# Patient Record
Sex: Female | Born: 1990 | Race: Black or African American | Hispanic: No | Marital: Single | Smoking: Current every day smoker
Health system: Southern US, Community
[De-identification: ages and names within clinical notes are randomized; demographics above are authoritative.]

## PROBLEM LIST (undated history)

## (undated) DIAGNOSIS — F32A Depression, unspecified: Secondary | ICD-10-CM

## (undated) DIAGNOSIS — F329 Major depressive disorder, single episode, unspecified: Secondary | ICD-10-CM

## (undated) HISTORY — PX: NO PAST SURGERIES: SHX2092

## (undated) HISTORY — DX: Depression, unspecified: F32.A

## (undated) HISTORY — DX: Major depressive disorder, single episode, unspecified: F32.9

---
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
## 2010-01-10 ENCOUNTER — Emergency Department (HOSPITAL_COMMUNITY): Admission: EM | Admit: 2010-01-10 | Discharge: 2010-01-10 | Payer: Self-pay | Admitting: Emergency Medicine

## 2010-07-10 ENCOUNTER — Emergency Department: Payer: Self-pay | Admitting: Emergency Medicine

## 2010-08-28 ENCOUNTER — Emergency Department (HOSPITAL_COMMUNITY): Admission: EM | Admit: 2010-08-28 | Discharge: 2010-08-28 | Payer: Self-pay | Admitting: Emergency Medicine

## 2010-09-26 ENCOUNTER — Encounter: Payer: Self-pay | Admitting: Emergency Medicine

## 2010-09-26 ENCOUNTER — Inpatient Hospital Stay (HOSPITAL_COMMUNITY): Admission: AD | Admit: 2010-09-26 | Discharge: 2010-09-26 | Payer: Self-pay | Admitting: Obstetrics and Gynecology

## 2010-11-17 NOTE — L&D Delivery Note (Signed)
Delivery Note At 5:43 PM a viable female was delivered via Vaginal, Spontaneous Delivery (Presentation: ; Occiput Anterior).  APGAR: 9, 9; weight 5 lb 9.6 oz (2540 g).   Placenta status: Intact, Spontaneous.  Cord: 3 vessels with the following complications: None.  Cord p*not done  Anesthesia: Epidural  Episiotomy: None Lacerations:  Suture Repair: 2.0 Est. Blood Loss (mL):   Mom to postpartum.  Baby to nursery-stable.  MARSHALL,BERNARD A 09/01/2011, 6:00 PM

## 2010-12-21 ENCOUNTER — Emergency Department (HOSPITAL_COMMUNITY)
Admission: EM | Admit: 2010-12-21 | Discharge: 2010-12-21 | Disposition: A | Discharge status: Home / Self Care | Payer: Medicaid Other | Attending: Emergency Medicine | Admitting: Emergency Medicine

## 2010-12-21 DIAGNOSIS — R110 Nausea: Secondary | ICD-10-CM | POA: Insufficient documentation

## 2010-12-21 DIAGNOSIS — R220 Localized swelling, mass and lump, head: Secondary | ICD-10-CM | POA: Insufficient documentation

## 2010-12-21 DIAGNOSIS — K089 Disorder of teeth and supporting structures, unspecified: Secondary | ICD-10-CM | POA: Insufficient documentation

## 2010-12-21 DIAGNOSIS — R6883 Chills (without fever): Secondary | ICD-10-CM | POA: Insufficient documentation

## 2011-01-04 ENCOUNTER — Inpatient Hospital Stay (HOSPITAL_COMMUNITY)
Admission: AD | Admit: 2011-01-04 | Discharge: 2011-01-04 | Disposition: A | Discharge status: Home / Self Care | Payer: Medicaid Other | Source: Ambulatory Visit | Attending: Obstetrics & Gynecology | Admitting: Obstetrics & Gynecology

## 2011-01-04 ENCOUNTER — Inpatient Hospital Stay (HOSPITAL_COMMUNITY): Payer: Medicaid Other

## 2011-01-04 DIAGNOSIS — N760 Acute vaginitis: Secondary | ICD-10-CM

## 2011-01-04 DIAGNOSIS — B9689 Other specified bacterial agents as the cause of diseases classified elsewhere: Secondary | ICD-10-CM | POA: Insufficient documentation

## 2011-01-04 DIAGNOSIS — R109 Unspecified abdominal pain: Secondary | ICD-10-CM

## 2011-01-04 DIAGNOSIS — O2390 Unspecified genitourinary tract infection in pregnancy, unspecified trimester: Secondary | ICD-10-CM | POA: Insufficient documentation

## 2011-01-04 DIAGNOSIS — A499 Bacterial infection, unspecified: Secondary | ICD-10-CM

## 2011-01-04 LAB — URINALYSIS, ROUTINE W REFLEX MICROSCOPIC
Bilirubin Urine: NEGATIVE
Hgb urine dipstick: NEGATIVE
Protein, ur: NEGATIVE mg/dL
Specific Gravity, Urine: 1.025 (ref 1.005–1.030)
Urobilinogen, UA: 1 mg/dL (ref 0.0–1.0)
pH: 7 (ref 5.0–8.0)

## 2011-01-04 LAB — WET PREP, GENITAL
Trich, Wet Prep: NONE SEEN
Yeast Wet Prep HPF POC: NONE SEEN

## 2011-01-04 LAB — ABO/RH: ABO/RH(D): O POS

## 2011-01-04 LAB — HCG, QUANTITATIVE, PREGNANCY: hCG, Beta Chain, Quant, S: 4549 m[IU]/mL — ABNORMAL HIGH (ref <5)

## 2011-01-04 LAB — POCT PREGNANCY, URINE: Preg Test, Ur: POSITIVE

## 2011-01-06 LAB — GC/CHLAMYDIA PROBE AMP, GENITAL: Chlamydia, DNA Probe: NEGATIVE

## 2011-01-23 ENCOUNTER — Inpatient Hospital Stay (HOSPITAL_COMMUNITY)
Admission: AD | Admit: 2011-01-23 | Discharge: 2011-01-23 | Disposition: A | Discharge status: Home / Self Care | Payer: Medicaid Other | Source: Ambulatory Visit | Attending: Obstetrics & Gynecology | Admitting: Obstetrics & Gynecology

## 2011-01-23 ENCOUNTER — Encounter (HOSPITAL_COMMUNITY): Payer: Self-pay | Admitting: Radiology

## 2011-01-23 ENCOUNTER — Inpatient Hospital Stay (HOSPITAL_COMMUNITY): Payer: Medicaid Other

## 2011-01-23 DIAGNOSIS — R52 Pain, unspecified: Secondary | ICD-10-CM

## 2011-01-23 DIAGNOSIS — O9989 Other specified diseases and conditions complicating pregnancy, childbirth and the puerperium: Secondary | ICD-10-CM

## 2011-01-23 DIAGNOSIS — O99891 Other specified diseases and conditions complicating pregnancy: Secondary | ICD-10-CM | POA: Insufficient documentation

## 2011-01-28 LAB — CBC
HCT: 30.6 % — ABNORMAL LOW (ref 36.0–46.0)
Hemoglobin: 10.6 g/dL — ABNORMAL LOW (ref 12.0–15.0)
MCH: 30.6 pg (ref 26.0–34.0)
MCHC: 34.6 g/dL (ref 30.0–36.0)
MCV: 88.4 fL (ref 78.0–100.0)
Platelets: 239 10*3/uL (ref 150–400)
RBC: 3.46 MIL/uL — ABNORMAL LOW (ref 3.87–5.11)
RDW: 12.6 % (ref 11.5–15.5)
WBC: 10.6 10*3/uL — ABNORMAL HIGH (ref 4.0–10.5)

## 2011-01-28 LAB — DIFFERENTIAL
Basophils Absolute: 0 10*3/uL (ref 0.0–0.1)
Basophils Relative: 0 % (ref 0–1)
Eosinophils Absolute: 0.1 10*3/uL (ref 0.0–0.7)
Eosinophils Relative: 1 % (ref 0–5)
Lymphocytes Relative: 16 % (ref 12–46)
Lymphs Abs: 1.7 10*3/uL (ref 0.7–4.0)
Monocytes Absolute: 0.8 10*3/uL (ref 0.1–1.0)
Monocytes Relative: 7 % (ref 3–12)
Neutro Abs: 8.1 10*3/uL — ABNORMAL HIGH (ref 1.7–7.7)
Neutrophils Relative %: 76 % (ref 43–77)

## 2011-01-28 LAB — ABO/RH: ABO/RH(D): O POS

## 2011-01-28 LAB — HCG, QUANTITATIVE, PREGNANCY: hCG, Beta Chain, Quant, S: 857 m[IU]/mL — ABNORMAL HIGH (ref <5)

## 2011-02-05 LAB — URINALYSIS, ROUTINE W REFLEX MICROSCOPIC
Bilirubin Urine: NEGATIVE
Glucose, UA: NEGATIVE mg/dL
Ketones, ur: NEGATIVE mg/dL
Nitrite: NEGATIVE
Protein, ur: NEGATIVE mg/dL
Specific Gravity, Urine: 1.026 (ref 1.005–1.030)
Urobilinogen, UA: 0.2 mg/dL (ref 0.0–1.0)
pH: 6 (ref 5.0–8.0)

## 2011-02-05 LAB — URINE MICROSCOPIC-ADD ON

## 2011-02-05 LAB — URINE CULTURE
Colony Count: NO GROWTH
Culture: NO GROWTH

## 2011-02-05 LAB — POCT PREGNANCY, URINE: Preg Test, Ur: NEGATIVE

## 2011-03-24 LAB — ANTIBODY SCREEN: Antibody Screen: NEGATIVE

## 2011-03-24 LAB — HIV ANTIBODY (ROUTINE TESTING W REFLEX): HIV: NONREACTIVE

## 2011-03-24 LAB — ABO/RH: RH Type: POSITIVE

## 2011-03-24 LAB — RPR: RPR: NONREACTIVE

## 2011-03-24 LAB — HEPATITIS B SURFACE ANTIGEN: Hepatitis B Surface Ag: NEGATIVE

## 2011-03-25 LAB — RUBELLA ANTIBODY, IGM: Rubella: UNDETERMINED
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
## 2011-04-24 ENCOUNTER — Emergency Department (HOSPITAL_COMMUNITY)
Admission: EM | Admit: 2011-04-24 | Discharge: 2011-04-24 | Disposition: A | Discharge status: Home / Self Care | Payer: Medicaid Other | Attending: Emergency Medicine | Admitting: Emergency Medicine

## 2011-04-24 DIAGNOSIS — O9989 Other specified diseases and conditions complicating pregnancy, childbirth and the puerperium: Secondary | ICD-10-CM | POA: Insufficient documentation

## 2011-04-24 DIAGNOSIS — R109 Unspecified abdominal pain: Secondary | ICD-10-CM | POA: Insufficient documentation

## 2011-04-24 DIAGNOSIS — R110 Nausea: Secondary | ICD-10-CM | POA: Insufficient documentation

## 2011-04-24 LAB — URINALYSIS, ROUTINE W REFLEX MICROSCOPIC
Bilirubin Urine: NEGATIVE
Glucose, UA: NEGATIVE mg/dL
Hgb urine dipstick: NEGATIVE
Ketones, ur: 15 mg/dL — AB
Nitrite: NEGATIVE
Protein, ur: NEGATIVE mg/dL
Specific Gravity, Urine: 1.023 (ref 1.005–1.030)
Urobilinogen, UA: 1 mg/dL (ref 0.0–1.0)
pH: 6 (ref 5.0–8.0)

## 2011-05-04 ENCOUNTER — Emergency Department (HOSPITAL_COMMUNITY)
Admission: EM | Admit: 2011-05-04 | Discharge: 2011-05-04 | Disposition: A | Discharge status: Home / Self Care | Payer: Medicaid Other | Attending: Emergency Medicine | Admitting: Emergency Medicine

## 2011-05-04 DIAGNOSIS — N94819 Vulvodynia, unspecified: Secondary | ICD-10-CM | POA: Insufficient documentation

## 2011-05-04 DIAGNOSIS — N751 Abscess of Bartholin's gland: Secondary | ICD-10-CM | POA: Insufficient documentation

## 2011-05-07 ENCOUNTER — Inpatient Hospital Stay (HOSPITAL_COMMUNITY)
Admission: AD | Admit: 2011-05-07 | Discharge: 2011-05-07 | Disposition: A | Discharge status: Home / Self Care | Payer: Medicaid Other | Source: Ambulatory Visit | Attending: Obstetrics | Admitting: Obstetrics

## 2011-05-07 DIAGNOSIS — O2390 Unspecified genitourinary tract infection in pregnancy, unspecified trimester: Secondary | ICD-10-CM

## 2011-05-07 DIAGNOSIS — N751 Abscess of Bartholin's gland: Secondary | ICD-10-CM | POA: Insufficient documentation

## 2011-05-07 DIAGNOSIS — Z79899 Other long term (current) drug therapy: Secondary | ICD-10-CM | POA: Insufficient documentation

## 2011-06-16 ENCOUNTER — Inpatient Hospital Stay (HOSPITAL_COMMUNITY): Admission: AD | Admit: 2011-06-16 | Payer: Self-pay | Source: Ambulatory Visit | Admitting: Obstetrics

## 2011-07-29 LAB — GC/CHLAMYDIA PROBE AMP, GENITAL: Chlamydia: POSITIVE

## 2011-07-31 ENCOUNTER — Other Ambulatory Visit (HOSPITAL_COMMUNITY): Payer: Self-pay | Admitting: Obstetrics

## 2011-07-31 DIAGNOSIS — Z3689 Encounter for other specified antenatal screening: Secondary | ICD-10-CM

## 2011-07-31 DIAGNOSIS — IMO0002 Reserved for concepts with insufficient information to code with codable children: Secondary | ICD-10-CM

## 2011-08-05 ENCOUNTER — Ambulatory Visit (HOSPITAL_COMMUNITY)
Admission: RE | Admit: 2011-08-05 | Discharge: 2011-08-05 | Disposition: A | Discharge status: Home / Self Care | Payer: Medicaid Other | Source: Ambulatory Visit | Attending: Obstetrics | Admitting: Obstetrics

## 2011-08-05 DIAGNOSIS — Z3689 Encounter for other specified antenatal screening: Secondary | ICD-10-CM

## 2011-08-05 DIAGNOSIS — IMO0002 Reserved for concepts with insufficient information to code with codable children: Secondary | ICD-10-CM

## 2011-08-05 DIAGNOSIS — O365990 Maternal care for other known or suspected poor fetal growth, unspecified trimester, not applicable or unspecified: Secondary | ICD-10-CM | POA: Insufficient documentation

## 2011-08-22 ENCOUNTER — Encounter (HOSPITAL_COMMUNITY): Payer: Self-pay | Admitting: *Deleted

## 2011-08-28 ENCOUNTER — Telehealth (HOSPITAL_COMMUNITY): Payer: Self-pay | Admitting: *Deleted

## 2011-08-28 ENCOUNTER — Encounter (HOSPITAL_COMMUNITY): Payer: Self-pay | Admitting: *Deleted

## 2011-08-28 NOTE — Telephone Encounter (Signed)
Preadmission screen  

## 2011-09-01 ENCOUNTER — Inpatient Hospital Stay (HOSPITAL_COMMUNITY): Payer: Medicaid Other | Admitting: Anesthesiology

## 2011-09-01 ENCOUNTER — Encounter (HOSPITAL_COMMUNITY): Payer: Self-pay | Admitting: Anesthesiology

## 2011-09-01 ENCOUNTER — Encounter (HOSPITAL_COMMUNITY): Payer: Self-pay

## 2011-09-01 ENCOUNTER — Inpatient Hospital Stay (HOSPITAL_COMMUNITY)
Admission: RE | Admit: 2011-09-01 | Discharge: 2011-09-04 | DRG: 775 | Disposition: A | Discharge status: Home / Self Care | Payer: Medicaid Other | Source: Ambulatory Visit | Attending: Obstetrics | Admitting: Obstetrics

## 2011-09-01 DIAGNOSIS — O99893 Other specified diseases and conditions complicating puerperium: Principal | ICD-10-CM | POA: Diagnosis not present

## 2011-09-01 DIAGNOSIS — R030 Elevated blood-pressure reading, without diagnosis of hypertension: Secondary | ICD-10-CM | POA: Diagnosis not present

## 2011-09-01 LAB — CBC
HCT: 35.2 % — ABNORMAL LOW (ref 36.0–46.0)
HCT: 37.3 % (ref 36.0–46.0)
Hemoglobin: 12.8 g/dL (ref 12.0–15.0)
Hemoglobin: 13.2 g/dL (ref 12.0–15.0)
MCH: 32.5 pg (ref 26.0–34.0)
MCH: 32.3 pg (ref 26.0–34.0)
MCHC: 34.7 g/dL (ref 30.0–36.0)
MCHC: 35.6 g/dL (ref 30.0–36.0)
MCHC: 35.4 g/dL (ref 30.0–36.0)
MCV: 91.2 fL (ref 78.0–100.0)
Platelets: 189 10*3/uL (ref 150–400)
Platelets: 188 10*3/uL (ref 150–400)
RBC: 3.94 MIL/uL (ref 3.87–5.11)
RDW: 12.9 % (ref 11.5–15.5)
WBC: 8.4 10*3/uL (ref 4.0–10.5)

## 2011-09-01 LAB — COMPREHENSIVE METABOLIC PANEL
ALT: 12 U/L (ref 0–35)
AST: 24 U/L (ref 0–37)
Alkaline Phosphatase: 164 U/L — ABNORMAL HIGH (ref 39–117)
CO2: 26 mEq/L (ref 19–32)
Calcium: 9.8 mg/dL (ref 8.4–10.5)
Glucose, Bld: 93 mg/dL (ref 70–99)
Potassium: 3.9 mEq/L (ref 3.5–5.1)
Sodium: 133 mEq/L — ABNORMAL LOW (ref 135–145)
Total Protein: 7.3 g/dL (ref 6.0–8.3)

## 2011-09-01 LAB — RPR: RPR Ser Ql: NONREACTIVE

## 2011-09-01 MED ORDER — OXYCODONE-ACETAMINOPHEN 5-325 MG PO TABS: 2.0000 | ORAL_TABLET | ORAL | Status: DC | PRN

## 2011-09-01 MED ORDER — LACTATED RINGERS IV SOLN: 500.0000 mL | INTRAVENOUS | Status: DC | PRN

## 2011-09-01 MED ORDER — OXYTOCIN 20 UNITS IN LACTATED RINGERS INFUSION - SIMPLE
125.0000 mL/h | Freq: Once | INTRAVENOUS | Status: AC
  Administered 2011-09-01: 999 mL/h via INTRAVENOUS

## 2011-09-01 MED ORDER — PHENYLEPHRINE 40 MCG/ML (10ML) SYRINGE FOR IV PUSH (FOR BLOOD PRESSURE SUPPORT)
80.0000 ug | PREFILLED_SYRINGE | INTRAVENOUS | Status: DC | PRN
  Filled 2011-09-01 (×2): qty 5

## 2011-09-01 MED ORDER — FLEET ENEMA 7-19 GM/118ML RE ENEM: 1.0000 | ENEMA | RECTAL | Status: DC | PRN

## 2011-09-01 MED ORDER — MAGNESIUM SULFATE 40 G IN LACTATED RINGERS - SIMPLE
2.0000 g/h | INTRAVENOUS | Status: DC
  Administered 2011-09-01: 8 g/h via INTRAVENOUS
  Filled 2011-09-01: qty 500

## 2011-09-01 MED ORDER — CITRIC ACID-SODIUM CITRATE 334-500 MG/5ML PO SOLN: 30.0000 mL | ORAL | Status: DC | PRN

## 2011-09-01 MED ORDER — SODIUM CHLORIDE 0.9 % IJ SOLN: 3.0000 mL | Freq: Two times a day (BID) | INTRAMUSCULAR | Status: DC

## 2011-09-01 MED ORDER — MISOPROSTOL 25 MCG QUARTER TABLET
25.0000 ug | ORAL_TABLET | ORAL | Status: DC
  Administered 2011-09-01: 25 ug via VAGINAL
  Filled 2011-09-01 (×3): qty 1
  Filled 2011-09-01: qty 0.25
  Filled 2011-09-01: qty 1

## 2011-09-01 MED ORDER — LABETALOL HCL 5 MG/ML IV SOLN
INTRAVENOUS | Status: AC
  Filled 2011-09-01: qty 4

## 2011-09-01 MED ORDER — ACETAMINOPHEN 325 MG PO TABS: 650.0000 mg | ORAL_TABLET | ORAL | Status: DC | PRN

## 2011-09-01 MED ORDER — PROMETHAZINE HCL 25 MG/ML IJ SOLN: 12.5000 mg | INTRAMUSCULAR | Status: DC | PRN

## 2011-09-01 MED ORDER — FERROUS SULFATE 325 (65 FE) MG PO TABS
325.0000 mg | ORAL_TABLET | Freq: Two times a day (BID) | ORAL | Status: DC
  Administered 2011-09-02 – 2011-09-04 (×4): 325 mg via ORAL
  Filled 2011-09-01 (×4): qty 1

## 2011-09-01 MED ORDER — BUTORPHANOL TARTRATE 2 MG/ML IJ SOLN
1.0000 mg | INTRAMUSCULAR | Status: DC | PRN
  Administered 2011-09-01 (×2): 1 mg via INTRAVENOUS
  Filled 2011-09-01: qty 1

## 2011-09-01 MED ORDER — SODIUM CHLORIDE 0.9 % IJ SOLN: 3.0000 mL | INTRAMUSCULAR | Status: DC | PRN

## 2011-09-01 MED ORDER — LIDOCAINE HCL 1.5 % IJ SOLN
INTRAMUSCULAR | Status: DC | PRN
  Administered 2011-09-01 (×2): 5 mL via INTRADERMAL
  Administered 2011-09-01: 2 mL via INTRADERMAL

## 2011-09-01 MED ORDER — ZOLPIDEM TARTRATE 5 MG PO TABS: 5.0000 mg | ORAL_TABLET | Freq: Every evening | ORAL | Status: DC | PRN

## 2011-09-01 MED ORDER — TETANUS-DIPHTH-ACELL PERTUSSIS 5-2.5-18.5 LF-MCG/0.5 IM SUSP
0.5000 mL | Freq: Once | INTRAMUSCULAR | Status: DC
  Filled 2011-09-01: qty 0.5

## 2011-09-01 MED ORDER — SIMETHICONE 80 MG PO CHEW: 80.0000 mg | CHEWABLE_TABLET | ORAL | Status: DC | PRN

## 2011-09-01 MED ORDER — ONDANSETRON HCL 4 MG/2ML IJ SOLN: 4.0000 mg | Freq: Four times a day (QID) | INTRAMUSCULAR | Status: DC | PRN

## 2011-09-01 MED ORDER — BUTORPHANOL TARTRATE 2 MG/ML IJ SOLN
INTRAMUSCULAR | Status: AC
  Administered 2011-09-01: 1 mg via INTRAVENOUS
  Filled 2011-09-01: qty 1

## 2011-09-01 MED ORDER — IBUPROFEN 600 MG PO TABS: 600.0000 mg | ORAL_TABLET | Freq: Four times a day (QID) | ORAL | Status: DC | PRN

## 2011-09-01 MED ORDER — INFLUENZA VIRUS VACC SPLIT PF IM SUSP
0.5000 mL | Freq: Once | INTRAMUSCULAR | Status: AC
  Administered 2011-09-02: 0.5 mL via INTRAMUSCULAR
  Filled 2011-09-01: qty 0.5

## 2011-09-01 MED ORDER — SODIUM CHLORIDE 0.9 % IV SOLN: 250.0000 mL | INTRAVENOUS | Status: DC

## 2011-09-01 MED ORDER — LIDOCAINE HCL (PF) 1 % IJ SOLN
30.0000 mL | INTRAMUSCULAR | Status: DC | PRN
  Filled 2011-09-01 (×2): qty 30

## 2011-09-01 MED ORDER — FENTANYL 2.5 MCG/ML BUPIVACAINE 1/10 % EPIDURAL INFUSION (WH - ANES)
14.0000 mL/h | INTRAMUSCULAR | Status: DC
  Administered 2011-09-01: 14 mL/h via EPIDURAL
  Filled 2011-09-01 (×2): qty 60

## 2011-09-01 MED ORDER — SENNOSIDES-DOCUSATE SODIUM 8.6-50 MG PO TABS
2.0000 | ORAL_TABLET | Freq: Every day | ORAL | Status: DC
  Administered 2011-09-01 – 2011-09-02 (×2): 2 via ORAL

## 2011-09-01 MED ORDER — OXYCODONE-ACETAMINOPHEN 5-325 MG PO TABS: 1.0000 | ORAL_TABLET | ORAL | Status: DC | PRN

## 2011-09-01 MED ORDER — ONDANSETRON HCL 4 MG PO TABS: 4.0000 mg | ORAL_TABLET | ORAL | Status: DC | PRN

## 2011-09-01 MED ORDER — BENZOCAINE-MENTHOL 20-0.5 % EX AERO: 1.0000 "application " | INHALATION_SPRAY | CUTANEOUS | Status: DC | PRN

## 2011-09-01 MED ORDER — DIPHENHYDRAMINE HCL 50 MG/ML IJ SOLN: 12.5000 mg | INTRAMUSCULAR | Status: DC | PRN

## 2011-09-01 MED ORDER — MAGNESIUM SULFATE BOLUS VIA INFUSION
4.0000 g | Freq: Once | INTRAVENOUS | Status: DC
  Filled 2011-09-01: qty 500

## 2011-09-01 MED ORDER — LANOLIN HYDROUS EX OINT: TOPICAL_OINTMENT | CUTANEOUS | Status: DC | PRN

## 2011-09-01 MED ORDER — ONDANSETRON HCL 4 MG/2ML IJ SOLN: 4.0000 mg | INTRAMUSCULAR | Status: DC | PRN

## 2011-09-01 MED ORDER — WITCH HAZEL-GLYCERIN EX PADS: 1.0000 "application " | MEDICATED_PAD | CUTANEOUS | Status: DC | PRN

## 2011-09-01 MED ORDER — PHENYLEPHRINE 40 MCG/ML (10ML) SYRINGE FOR IV PUSH (FOR BLOOD PRESSURE SUPPORT)
80.0000 ug | PREFILLED_SYRINGE | INTRAVENOUS | Status: DC | PRN
  Filled 2011-09-01: qty 5

## 2011-09-01 MED ORDER — OXYTOCIN BOLUS FROM INFUSION
500.0000 mL | Freq: Once | INTRAVENOUS | Status: DC
Start: 2011-09-01 — End: 2011-09-01
  Filled 2011-09-01: qty 1000
  Filled 2011-09-01: qty 500

## 2011-09-01 MED ORDER — LABETALOL HCL 5 MG/ML IV SOLN
40.0000 mg | Freq: Once | INTRAVENOUS | Status: AC
  Administered 2011-09-01: 40 mg via INTRAVENOUS

## 2011-09-01 MED ORDER — EPHEDRINE 5 MG/ML INJ
10.0000 mg | INTRAVENOUS | Status: DC | PRN
  Filled 2011-09-01 (×2): qty 4

## 2011-09-01 MED ORDER — DIBUCAINE 1 % RE OINT: 1.0000 "application " | TOPICAL_OINTMENT | RECTAL | Status: DC | PRN

## 2011-09-01 MED ORDER — PRENATAL PLUS 27-1 MG PO TABS
1.0000 | ORAL_TABLET | Freq: Every day | ORAL | Status: DC
  Administered 2011-09-02 – 2011-09-04 (×3): 1 via ORAL
  Filled 2011-09-01 (×3): qty 1

## 2011-09-01 MED ORDER — EPHEDRINE 5 MG/ML INJ
10.0000 mg | INTRAVENOUS | Status: DC | PRN
  Filled 2011-09-01: qty 4

## 2011-09-01 MED ORDER — LABETALOL HCL 5 MG/ML IV SOLN
INTRAVENOUS | Status: AC
  Administered 2011-09-01: 40 mg via INTRAVENOUS
  Filled 2011-09-01: qty 4

## 2011-09-01 MED ORDER — DIPHENHYDRAMINE HCL 25 MG PO CAPS: 25.0000 mg | ORAL_CAPSULE | Freq: Four times a day (QID) | ORAL | Status: DC | PRN

## 2011-09-01 MED ORDER — ZOLPIDEM TARTRATE 10 MG PO TABS: 10.0000 mg | ORAL_TABLET | Freq: Every evening | ORAL | Status: DC | PRN

## 2011-09-01 MED ORDER — IBUPROFEN 600 MG PO TABS
600.0000 mg | ORAL_TABLET | Freq: Four times a day (QID) | ORAL | Status: DC
  Administered 2011-09-02 – 2011-09-04 (×9): 600 mg via ORAL
  Filled 2011-09-01 (×9): qty 1

## 2011-09-01 MED ORDER — LACTATED RINGERS IV SOLN
500.0000 mL | Freq: Once | INTRAVENOUS | Status: AC
  Administered 2011-09-01: 1000 mL via INTRAVENOUS

## 2011-09-01 MED ORDER — LACTATED RINGERS IV SOLN
INTRAVENOUS | Status: DC
  Administered 2011-09-01: 22:00:00 via INTRAVENOUS

## 2011-09-01 MED ORDER — LACTATED RINGERS IV SOLN
INTRAVENOUS | Status: DC
  Administered 2011-09-01 (×4): via INTRAVENOUS

## 2011-09-01 MED ORDER — TERBUTALINE SULFATE 1 MG/ML IJ SOLN: 0.2500 mg | Freq: Once | INTRAMUSCULAR | Status: DC | PRN

## 2011-09-01 NOTE — Anesthesia Preprocedure Evaluation (Signed)
Anesthesia Evaluation  Name, MR# and DOB Patient awake  General Assessment Comment  Reviewed: Allergy & Precautions, H&P , NPO status , Patient's Chart, lab work & pertinent test results, reviewed documented beta blocker date and time   History of Anesthesia Complications Negative for: history of anesthetic complications  Airway Mallampati: II TM Distance: >3 FB Neck ROM: full    Dental  (+) Teeth Intact   Pulmonary asthma (self-diagnosed, has used an inhaler once) Current Smoker  clear to auscultation        Cardiovascular regular Normal    Neuro/Psych PSYCHIATRIC DISORDERS (depression) Negative Neurological ROS     GI/Hepatic negative GI ROS Neg liver ROS    Endo/Other  Negative Endocrine ROS  Renal/GU negative Renal ROS  Genitourinary negative   Musculoskeletal   Abdominal   Peds  Hematology negative hematology ROS (+)   Anesthesia Other Findings   Reproductive/Obstetrics (+) Pregnancy                           Anesthesia Physical Anesthesia Plan  ASA: II  Anesthesia Plan: Epidural   Post-op Pain Management:    Induction:   Airway Management Planned:   Additional Equipment:   Intra-op Plan:   Post-operative Plan:   Informed Consent: I have reviewed the patients History and Physical, chart, labs and discussed the procedure including the risks, benefits and alternatives for the proposed anesthesia with the patient or authorized representative who has indicated his/her understanding and acceptance.     Plan Discussed with:   Anesthesia Plan Comments:         Anesthesia Quick Evaluation

## 2011-09-01 NOTE — Progress Notes (Signed)
Patient is fully dilated in the post 2 station she is not on Pitocin and underwent and in labor this morning spontaneously after the Cytotec was inserted and and

## 2011-09-01 NOTE — Progress Notes (Signed)
Dr. Malen Gauze notified of CBC results.  Order received to remove epidural catheter.

## 2011-09-01 NOTE — H&P (Signed)
This is Dr. Francoise Ceo dictating the history and physical on Gloria King She's a gravida 3 para 00-0 at 40 weeks and 3 days EDC 08/29/2011 Negative GBS in for induction Cervix 1 cm 80% vertex -3 not contracting and she will be placed on Cytotec 25 Max x2 today And then Pitocin if necessary Past medical history negative Past surgical history negative Family history negative Social history negative Physical exam A well-developed female in no distress HEENT negative Breasts negative Heart regular is in no murmurs no gallops Lungs clear Abdomen term Pelvic as described above Extremities negative

## 2011-09-01 NOTE — Anesthesia Procedure Notes (Signed)
Epidural Patient location during procedure: OB Start time: 09/01/2011 1:24 PM Reason for block: procedure for pain  Staffing Performed by: anesthesiologist   Preanesthetic Checklist Completed: patient identified, site marked, surgical consent, pre-op evaluation, timeout performed, IV checked, risks and benefits discussed and monitors and equipment checked  Epidural Patient position: sitting Prep: site prepped and draped and DuraPrep Patient monitoring: continuous pulse ox and blood pressure Approach: midline Injection technique: LOR air  Needle:  Needle type: Tuohy  Needle gauge: 17 G Needle length: 9 cm Needle insertion depth: 6 cm Catheter type: closed end flexible Catheter size: 19 Gauge Catheter at skin depth: 11 cm Test dose: negative  Assessment Events: blood not aspirated, injection not painful, no injection resistance, negative IV test and no paresthesia  Additional Notes Discussed risk of headache, infection, bleeding, nerve injury and failed or incomplete block.  Patient voices understanding and wishes to proceed.  Patient impatient during informed consent and pre-evaluation.

## 2011-09-01 NOTE — Progress Notes (Signed)
Pt rec'd from [facility] via wheelchair.  Ambulated to BR with minimal assistance, voided qs.  Peri care, ice pack applied to perineum.  Teaching done.  Ambulatory to bed, tolerated well, steady gait and no c/o lighthead or dizziness.   In bed, SR up x 2.  Admitting wt obtained.  Monitor applied, VS obtained.  Initial assessment completed, 1 bt clonus noted bilaterally.  HOB elevated.  Plan of care discusssed.  SO at bs, supportive.  Baby in room.

## 2011-09-02 LAB — COMPREHENSIVE METABOLIC PANEL
ALT: 11 U/L (ref 0–35)
AST: 23 U/L (ref 0–37)
Albumin: 2.4 g/dL — ABNORMAL LOW (ref 3.5–5.2)
Alkaline Phosphatase: 121 U/L — ABNORMAL HIGH (ref 39–117)
BUN: 4 mg/dL — ABNORMAL LOW (ref 6–23)
CO2: 25 mEq/L (ref 19–32)
Calcium: 7.7 mg/dL — ABNORMAL LOW (ref 8.4–10.5)
Chloride: 102 mEq/L (ref 96–112)
Creatinine, Ser: 0.47 mg/dL — ABNORMAL LOW (ref 0.50–1.10)
GFR calc Af Amer: >90 mL/min (ref >90)
GFR calc non Af Amer: >90 mL/min (ref >90)
Glucose, Bld: 89 mg/dL (ref 70–99)
Potassium: 3.6 mEq/L (ref 3.5–5.1)
Sodium: 133 mEq/L — ABNORMAL LOW (ref 135–145)
Total Bilirubin: 0.2 mg/dL — ABNORMAL LOW (ref 0.3–1.2)
Total Protein: 5.6 g/dL — ABNORMAL LOW (ref 6.0–8.3)

## 2011-09-02 LAB — CBC
HCT: 30.6 % — ABNORMAL LOW (ref 36.0–46.0)
Hemoglobin: 10.8 g/dL — ABNORMAL LOW (ref 12.0–15.0)
MCH: 32.1 pg (ref 26.0–34.0)
MCHC: 35.3 g/dL (ref 30.0–36.0)
RBC: 3.36 MIL/uL — ABNORMAL LOW (ref 3.87–5.11)

## 2011-09-02 LAB — URIC ACID: Uric Acid, Serum: 3.6 mg/dL (ref 2.4–7.0)

## 2011-09-02 LAB — LACTATE DEHYDROGENASE: LDH: 206 U/L (ref 94–250)

## 2011-09-02 LAB — MRSA PCR SCREENING: MRSA by PCR: NEGATIVE

## 2011-09-02 NOTE — Progress Notes (Signed)
  Postpartum day one Vital signs normal Fundus firm Legs negative Her blood pressures have all been normal so the magnesium sulfate was discontinued today and patient transferred deferred ALT of him ICU She has no complaints

## 2011-09-02 NOTE — Anesthesia Postprocedure Evaluation (Signed)
  Anesthesia Post-op Note  Patient: Gloria King  Procedure(s) Performed: * No procedures listed *  Patient Location: Mother/Baby  Anesthesia Type: Epidural  Level of Consciousness: awake, alert  and oriented  Airway and Oxygen Therapy: Patient Spontanous Breathing  Post-op Pain: mild  Post-op Assessment: Patient's Cardiovascular Status Stable, Respiratory Function Stable, Patent Airway, No signs of Nausea or vomiting and Adequate PO intake  Post-op Vital Signs: stable  Complications: No apparent anesthesia complications

## 2011-09-02 NOTE — Progress Notes (Signed)
UR chart review completed.  

## 2011-09-03 MED ORDER — AMLODIPINE BESYLATE 10 MG PO TABS
10.0000 mg | ORAL_TABLET | Freq: Every day | ORAL | Status: DC
  Administered 2011-09-03 – 2011-09-04 (×2): 10 mg via ORAL
  Filled 2011-09-03 (×2): qty 1

## 2011-09-03 MED ORDER — LABETALOL HCL 200 MG PO TABS
200.0000 mg | ORAL_TABLET | Freq: Three times a day (TID) | ORAL | Status: DC
  Administered 2011-09-03 – 2011-09-04 (×4): 200 mg via ORAL
  Filled 2011-09-03 (×4): qty 1

## 2011-09-03 NOTE — Discharge Summary (Signed)
Obstetric Discharge Summary Reason for Admission: induction of labor Prenatal Procedures: none Intrapartum Procedures: spontaneous vaginal delivery Postpartum Procedures: none Complications-Operative and Postpartum: none Hemoglobin  Date Value Range Status  09/02/2011 10.8* 12.0-15.0 (g/dL) Final     DELTA CHECK NOTED     REPEATED TO VERIFY     HCT  Date Value Range Status  09/02/2011 30.6* 36.0-46.0 (%) Final    Discharge Diagnoses: Term Pregnancy-delivered  Discharge Information: Date: 09/03/2011 Activity: pelvic rest Diet: routine Medications: Percocet Condition: stable Instructions: refer to practice specific booklet Discharge to: home Follow-up Information    Follow up with MARSHALL,BERNARD A, MD. Call in 6 weeks.   Contact information:   [address] [place] [address] [phone]          Newborn Data: Live born female  Birth Weight: 5 lb 9.6 oz (2540 g) APGAR: 9, 9  Home with mother.  MARSHALL,BERNARD A 09/03/2011, 8:05 AM

## 2011-09-04 MED ORDER — AMLODIPINE BESYLATE 10 MG PO TABS: 10.0000 mg | ORAL_TABLET | Freq: Every day | ORAL | Status: DC

## 2011-09-04 MED ORDER — CITRIC ACID-SODIUM CITRATE 334-500 MG/5ML PO SOLN
ORAL | Status: AC
  Filled 2011-09-04: qty 15

## 2011-09-04 MED ORDER — LABETALOL HCL 200 MG PO TABS: 200.0000 mg | ORAL_TABLET | Freq: Three times a day (TID) | ORAL | Status: DC

## 2011-09-04 MED ORDER — MEASLES, MUMPS & RUBELLA VAC ~~LOC~~ INJ
0.5000 mL | INJECTION | Freq: Once | SUBCUTANEOUS | Status: AC
  Administered 2011-09-04: 0.5 mL via SUBCUTANEOUS
  Filled 2011-09-04: qty 0.5

## 2011-09-04 NOTE — Discharge Summary (Signed)
Obstetric Discharge Summary Reason for Admission: onset of labor Prenatal Procedures: none Intrapartum Procedures: spontaneous vaginal delivery Postpartum Procedures: none Complications-Operative and Postpartum: none Hemoglobin  Date Value Range Status  09/02/2011 10.8* 12.0-15.0 (g/dL) Final     DELTA CHECK NOTED     REPEATED TO VERIFY     HCT  Date Value Range Status  09/02/2011 30.6* 36.0-46.0 (%) Final    Discharge Diagnoses: Term Pregnancy-delivered  Discharge Information: Date: 09/04/2011 Activity: pelvic rest Diet: routine Medications: Tylenol #3 Condition: stable Instructions: refer to practice specific booklet Discharge to: home Follow-up Information    Follow up with MARSHALL,BERNARD A, MD. Call in 1 week.   Contact information:   [address] [place] [address] [phone]          Newborn Data: Live born female  Birth Weight: 5 lb 9.6 oz (2540 g) APGAR: 9, 9  Home with mother.  MARSHALL,BERNARD A 09/04/2011, 7:15 AM

## 2011-09-04 NOTE — Progress Notes (Signed)
  Postpartum day #3 Blood pressure is normal discharge was canceled yesterday because of elevated blood pressures She was started on labetalol 200 mg of 8 aloes and Norvasc 10 mg daily She is to see me in the office in one week for blood pressure check Lochia moderate legs negative no complaints Home today on labetalol and Norvasc a Fundus firm

## 2011-11-19 IMAGING — US US TRANSVAGINAL NON-OB
1 series · 13 of 25 positions shown · non-contrast
Comparison: None.

CLINICAL DATA: Status post therapeutic portion 09/20/2010 with
heavy vaginal bleeding.



[Series 1: us transvaginal non-ob · 0.28mm/px · 13 of 53 slices shown]
[im 1/53]
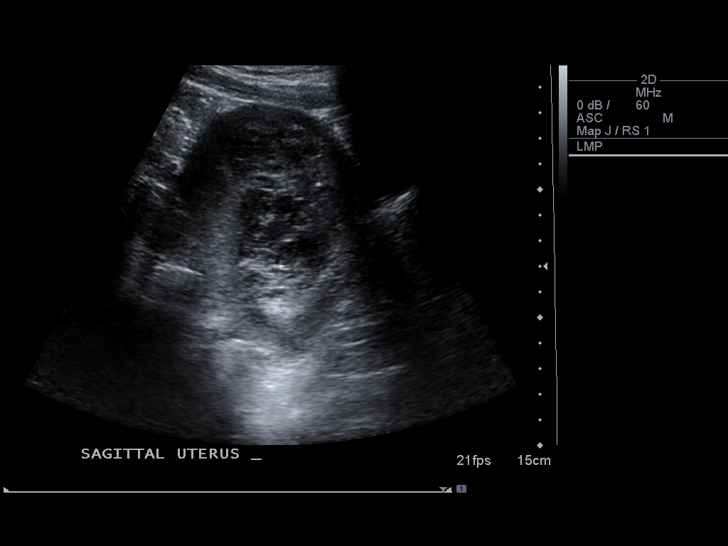
[im 5/53]
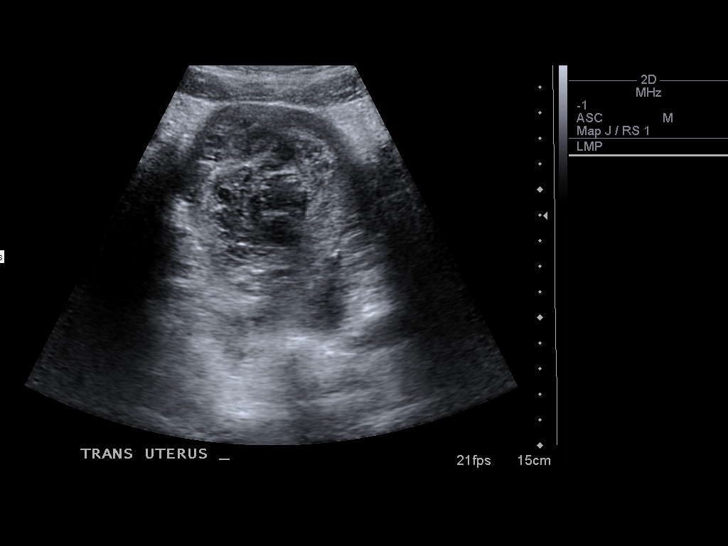
[im 9/53]
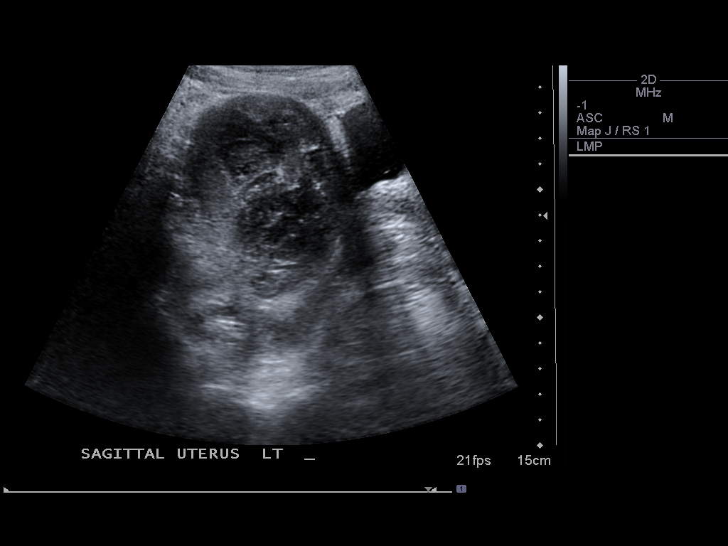
[im 14/53]
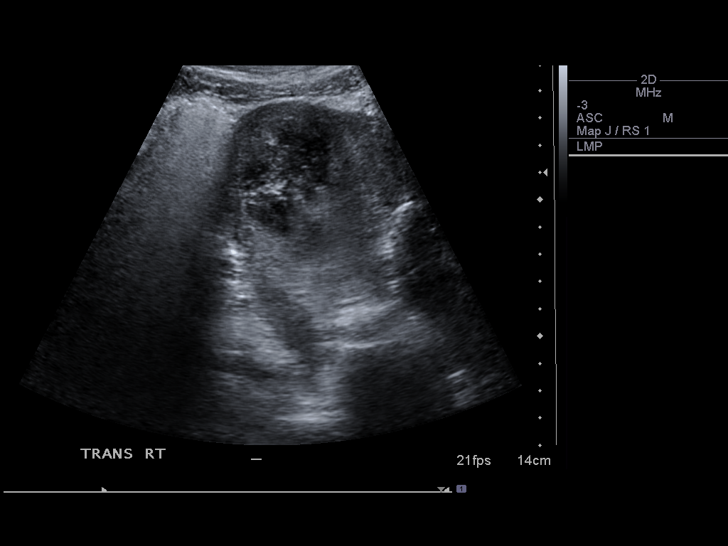
[im 18/53]
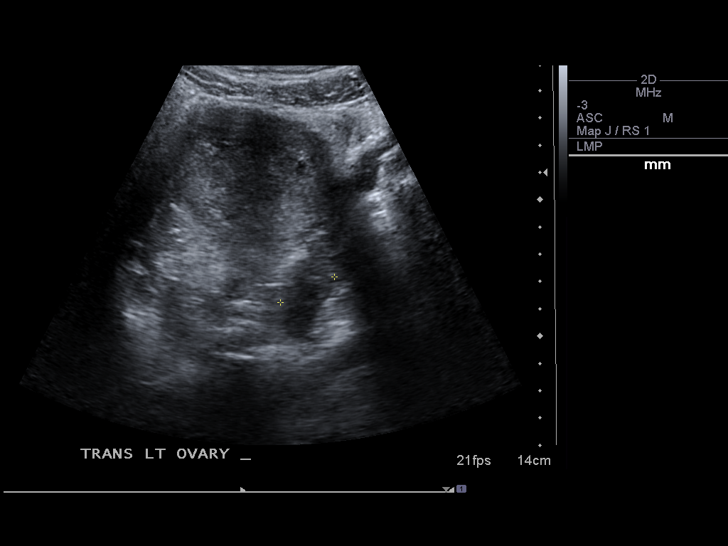
[im 22/53]
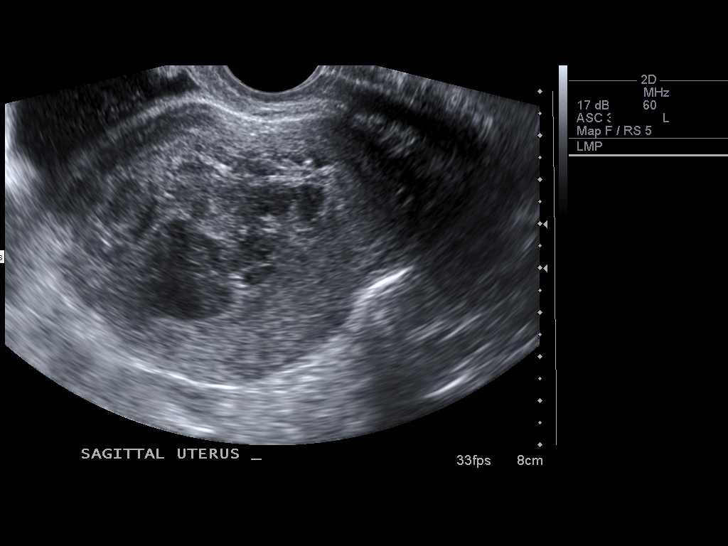
[im 27/53]
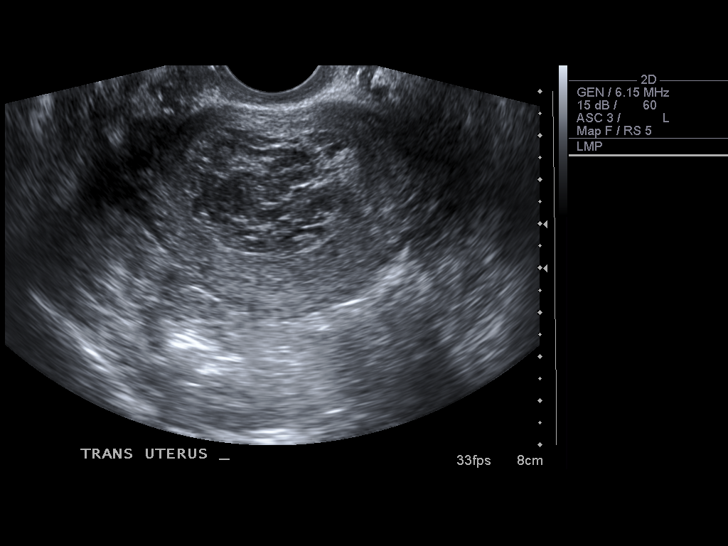
[im 31/53]
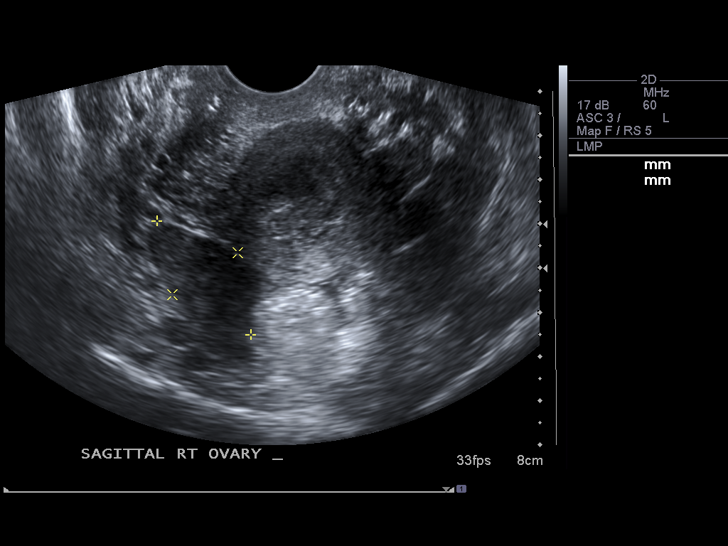
[im 35/53]
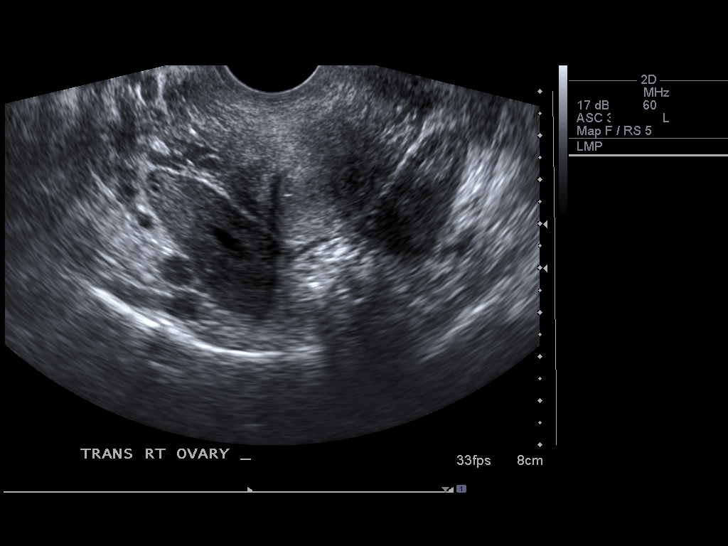
[im 40/53]
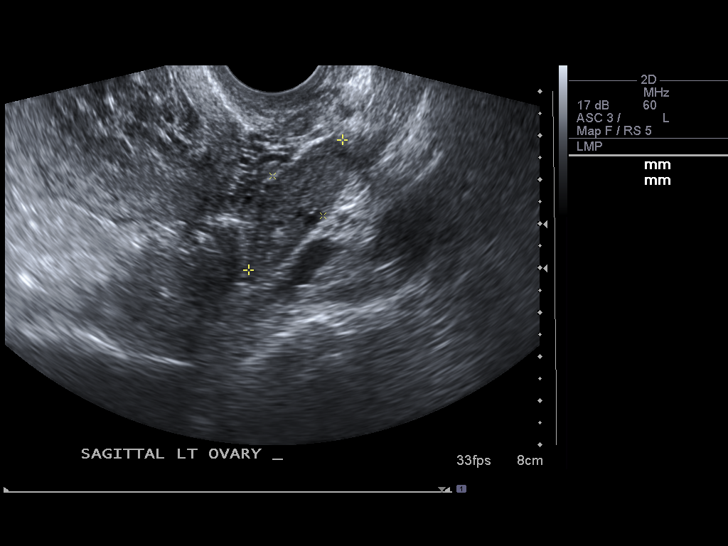
[im 44/53]
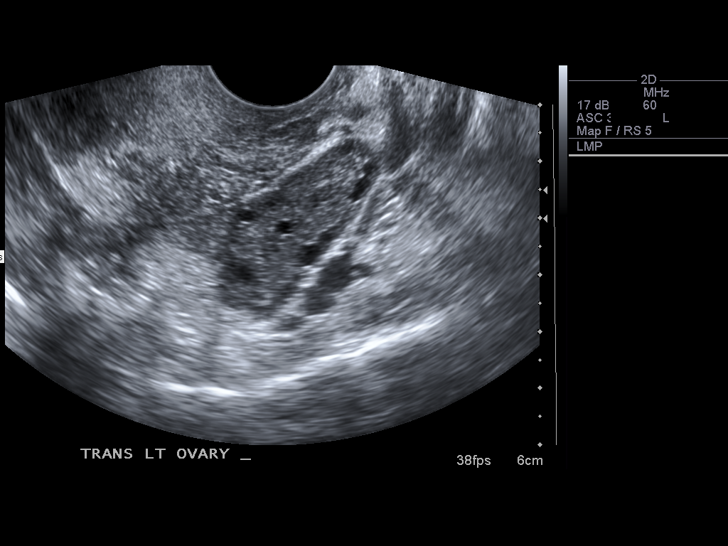
[im 48/53]
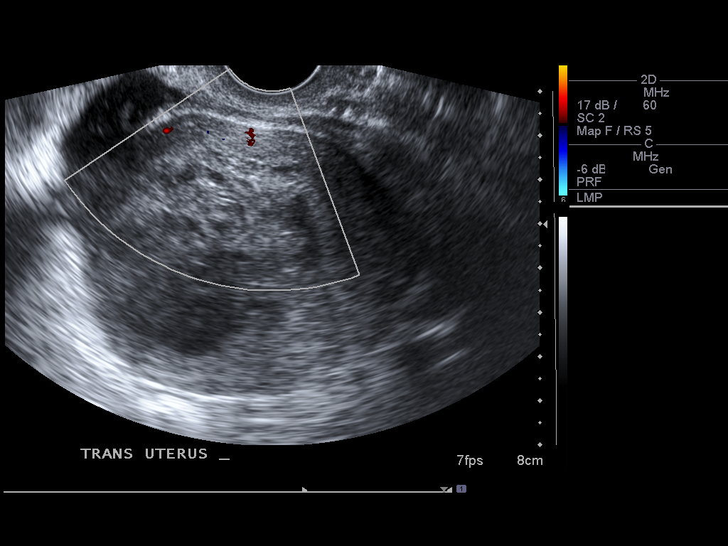
[im 53/53]
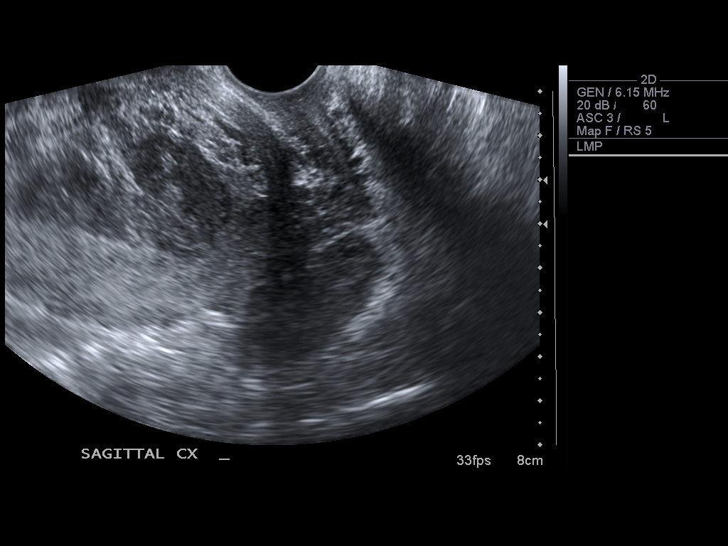

[13 of 25 positions shown; findings below may reference images not displayed]

FINDINGS: Uterus the uterus has a sagittal length of 9.7 cm, an AP depth of
6.7 cm and a transverse width of 7.1 cm.  The myometrium appears
homogeneous.

Endometrium is distended with heterogeneous soft tissue measuring
7.6 x 4.2 x 4.8 cm. A portion of this soft tissue was interrogated
with color Doppler and demonstrates no intraluminal flow.  The
sonographic  appearance is most suggestive of a large amount of
intraluminal clot although foci of retained products of conception
are not completely excluded.

Right Ovary the right ovary has a normal appearance measuring 2.3 x
3.3 x 1.8 cm

Left Ovary has a normal appearance measuring 3.6 x 1.5 x 3.1 cm

Other Findings:  No pelvic fluid or separate adnexal masses are
noted
IMPRESSION: Large amount of intraluminal heterogeneous soft tissue within the
endometrial canal which has a sonographic appearance most
suspicious for retained blood products/clot.  No  definite focal
echogenic areas are seen to correlate with retained products of
conception although this cannot be excluded with absolute certainty
with this appearance. It should be noted that the entire contents
of the endometrium were not interrogated with color doppler. The
portion which was was avascular.

Normal ovaries.

## 2011-12-07 ENCOUNTER — Emergency Department (HOSPITAL_COMMUNITY)
Admission: EM | Admit: 2011-12-07 | Discharge: 2011-12-07 | Disposition: A | Discharge status: Home / Self Care | Payer: Medicaid Other | Attending: Emergency Medicine | Admitting: Emergency Medicine

## 2011-12-07 ENCOUNTER — Encounter (HOSPITAL_COMMUNITY): Payer: Self-pay | Admitting: *Deleted

## 2011-12-07 DIAGNOSIS — F329 Major depressive disorder, single episode, unspecified: Secondary | ICD-10-CM | POA: Insufficient documentation

## 2011-12-07 DIAGNOSIS — R350 Frequency of micturition: Secondary | ICD-10-CM | POA: Insufficient documentation

## 2011-12-07 DIAGNOSIS — F3289 Other specified depressive episodes: Secondary | ICD-10-CM | POA: Insufficient documentation

## 2011-12-07 DIAGNOSIS — N390 Urinary tract infection, site not specified: Secondary | ICD-10-CM | POA: Insufficient documentation

## 2011-12-07 DIAGNOSIS — J45909 Unspecified asthma, uncomplicated: Secondary | ICD-10-CM | POA: Insufficient documentation

## 2011-12-07 LAB — URINALYSIS, ROUTINE W REFLEX MICROSCOPIC
Bilirubin Urine: NEGATIVE
Glucose, UA: NEGATIVE mg/dL
Ketones, ur: NEGATIVE mg/dL
Protein, ur: 30 mg/dL — AB
Urobilinogen, UA: 0.2 mg/dL (ref 0.0–1.0)

## 2011-12-07 LAB — POCT PREGNANCY, URINE: Preg Test, Ur: NEGATIVE

## 2011-12-07 LAB — URINE MICROSCOPIC-ADD ON

## 2011-12-07 MED ORDER — CEPHALEXIN 250 MG PO CAPS
500.0000 mg | ORAL_CAPSULE | Freq: Once | ORAL | Status: AC
  Administered 2011-12-07: 500 mg via ORAL
  Filled 2011-12-07: qty 1

## 2011-12-07 MED ORDER — CEPHALEXIN 500 MG PO CAPS: 500.0000 mg | ORAL_CAPSULE | Freq: Four times a day (QID) | ORAL | Status: AC

## 2011-12-07 NOTE — ED Provider Notes (Signed)
History     CSN: [id number]  Arrival date & time 12/07/11  1119   First MD Initiated Contact with Patient 12/07/11 1134      Chief Complaint  Patient presents with  . Urinary Frequency    (Consider location/radiation/quality/duration/timing/severity/associated sxs/prior treatment) HPI Patient is a 21 year old female who presents today complaining of increased urinary frequency that she noticed last night. She denies any dysuria or hematuria. She denies any vaginal discharge or concerned that she could be pregnant or have a sexually transmitted disease. Patient denies any abdominal pain, nausea, vomiting, difficulty with bowel movements, or other concerning symptoms. She does have a history of urinary tract infections and thought that she could be getting one of these. For this reason the patient wanted to get checked out early before her symptoms got worse. She denies any back pain and does not have any history of kidney stones. There is nothing that has made her symptoms better or worse. Patient is currently using Depo-Provera control. Past Medical History  Diagnosis Date  . Asthma   . Depression     zoloft    Past Surgical History  Procedure Date  . No past surgeries     Family History  Problem Relation Age of Onset  . Anesthesia problems Neg Hx   . Hypotension Neg Hx   . Malignant hyperthermia Neg Hx   . Pseudochol deficiency Neg Hx     History  Substance Use Topics  . Smoking status: Current Everyday Smoker -- 0.2 packs/day  . Smokeless tobacco: Never Used  . Alcohol Use: No    OB History    Grav Para Term Preterm Abortions TAB SAB Ect Mult Living   3 1 1  2 2    1       Review of Systems  Constitutional: Negative.   HENT: Negative.   Eyes: Negative.   Respiratory: Negative.   Cardiovascular: Negative.   Gastrointestinal: Negative.   Genitourinary: Positive for frequency.  Musculoskeletal: Negative.   Skin: Negative.   Neurological: Negative.     Hematological: Negative.   Psychiatric/Behavioral: Negative.   All other systems reviewed and are negative.    Allergies  Review of patient's allergies indicates no known allergies.  Home Medications  No current outpatient prescriptions on file.  BP 127/73  Pulse 74  Temp(Src) 98.2 F (36.8 C) (Oral)  Resp 16  SpO2 97%  Physical Exam  Nursing note and vitals reviewed. Constitutional: She is oriented to person, place, and time. She appears well-developed and well-nourished. No distress.  HENT:  Head: Normocephalic and atraumatic.  Eyes: Conjunctivae and EOM are normal. Pupils are equal, round, and reactive to light.  Neck: Normal range of motion.  Cardiovascular: Normal rate, regular rhythm, normal heart sounds and intact distal pulses.  Exam reveals no gallop and no friction rub.   No murmur heard. Pulmonary/Chest: Effort normal and breath sounds normal. No respiratory distress. She has no wheezes. She has no rales.  Abdominal: Soft. Bowel sounds are normal. She exhibits no distension. There is no tenderness. There is no rebound and no guarding.  Musculoskeletal: Normal range of motion. She exhibits no edema and no tenderness.  Neurological: She is alert and oriented to person, place, and time. No cranial nerve deficit. She exhibits normal muscle tone. Coordination normal.  Skin: Skin is warm and dry. No rash noted.  Psychiatric: She has a normal mood and affect.    ED Course  Procedures (including critical care time)  Labs Reviewed  URINALYSIS, ROUTINE W REFLEX MICROSCOPIC - Abnormal; Notable for the following:    APPearance CLOUDY (*)    Hgb urine dipstick MODERATE (*)    Protein, ur 30 (*)    Leukocytes, UA MODERATE (*)    All other components within normal limits  URINE MICROSCOPIC-ADD ON - Abnormal; Notable for the following:    Squamous Epithelial / LPF FEW (*)    Bacteria, UA FEW (*)    All other components within normal limits  POCT PREGNANCY, URINE  POCT  PREGNANCY, URINE   No results found.   1. UTI (urinary tract infection)       MDM  Patient was evaluated by myself. She had absolutely no abdominal pain or other concerning symptoms. She denies any vaginal discharge or concerned she did have sexual transmitted disease pelvic exam was not warranted. Urinalysis and urine pregnancy tests were obtained. Patient was pain-free.  Patient's urine pregnancy was negative. Urinalysis was concerning for UTI. Patient was given a dose of Keflex here. She was given a seven-day prescription for this. Patient was discharged in good condition and can followup with her regular doctor as needed.        Cyndra Numbers, MD 12/07/11 1220

## 2011-12-07 NOTE — ED Notes (Signed)
Reports onset last night of frequent urination, denies any pain with urination.

## 2011-12-07 NOTE — ED Notes (Signed)
Pt d/c homew ith friend. NAD at this time. Pt denies pain

## 2011-12-07 NOTE — ED Notes (Signed)
MD at bedside. 

## 2012-04-28 ENCOUNTER — Emergency Department (HOSPITAL_COMMUNITY)
Admission: EM | Admit: 2012-04-28 | Discharge: 2012-04-28 | Disposition: A | Discharge status: Home / Self Care | Payer: No Typology Code available for payment source | Attending: Emergency Medicine | Admitting: Emergency Medicine

## 2012-04-28 ENCOUNTER — Encounter (HOSPITAL_COMMUNITY): Payer: Self-pay | Admitting: Emergency Medicine

## 2012-04-28 DIAGNOSIS — F17200 Nicotine dependence, unspecified, uncomplicated: Secondary | ICD-10-CM | POA: Insufficient documentation

## 2012-04-28 DIAGNOSIS — J45909 Unspecified asthma, uncomplicated: Secondary | ICD-10-CM | POA: Insufficient documentation

## 2012-04-28 DIAGNOSIS — T148XXA Other injury of unspecified body region, initial encounter: Secondary | ICD-10-CM

## 2012-04-28 DIAGNOSIS — F3289 Other specified depressive episodes: Secondary | ICD-10-CM | POA: Insufficient documentation

## 2012-04-28 DIAGNOSIS — S239XXA Sprain of unspecified parts of thorax, initial encounter: Secondary | ICD-10-CM | POA: Insufficient documentation

## 2012-04-28 DIAGNOSIS — F329 Major depressive disorder, single episode, unspecified: Secondary | ICD-10-CM | POA: Insufficient documentation

## 2012-04-28 MED ORDER — CYCLOBENZAPRINE HCL 10 MG PO TABS: 10.0000 mg | ORAL_TABLET | Freq: Two times a day (BID) | ORAL | Status: AC | PRN
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
## 2012-04-28 NOTE — ED Provider Notes (Signed)
Medical screening examination/treatment/procedure(s) were performed by non-physician practitioner and as supervising physician I was immediately available for consultation/collaboration.   Zanylah Hardie R Rekita Miotke, MD 04/28/12 1114 

## 2012-11-02 ENCOUNTER — Encounter (HOSPITAL_COMMUNITY): Payer: Self-pay | Admitting: Emergency Medicine

## 2012-11-02 ENCOUNTER — Emergency Department (INDEPENDENT_AMBULATORY_CARE_PROVIDER_SITE_OTHER)
Admission: EM | Admit: 2012-11-02 | Discharge: 2012-11-02 | Disposition: A | Discharge status: Home / Self Care | Payer: Medicaid Other | Source: Home / Self Care

## 2012-11-02 DIAGNOSIS — N390 Urinary tract infection, site not specified: Secondary | ICD-10-CM

## 2012-11-02 LAB — POCT URINALYSIS DIP (DEVICE)
Glucose, UA: NEGATIVE mg/dL
Hgb urine dipstick: NEGATIVE
Ketones, ur: 15 mg/dL — AB
Leukocytes, UA: NEGATIVE
Nitrite: POSITIVE — AB
Protein, ur: 100 mg/dL — AB
Specific Gravity, Urine: 1.025 (ref 1.005–1.030)
Urobilinogen, UA: 2 mg/dL — ABNORMAL HIGH (ref 0.0–1.0)
pH: 6 (ref 5.0–8.0)

## 2012-11-02 LAB — POCT PREGNANCY, URINE: Preg Test, Ur: NEGATIVE

## 2012-11-02 MED ORDER — CEPHALEXIN 500 MG PO CAPS: 500.0000 mg | ORAL_CAPSULE | Freq: Three times a day (TID) | ORAL | Status: DC

## 2012-11-02 NOTE — ED Provider Notes (Signed)
History     CSN: [id number]  Arrival date & time 11/02/12  1739   None     Chief Complaint  Patient presents with  . Urinary Tract Infection    (Consider location/radiation/quality/duration/timing/severity/associated sxs/prior treatment) HPI Comments: 21 year old female complaining of urinary frequency since this morning. She denies dysuria, abdominal pain, back pain nausea or vomiting.   Past Medical History  Diagnosis Date  . Asthma   . Depression     zoloft    Past Surgical History  Procedure Date  . No past surgeries     Family History  Problem Relation Age of Onset  . Anesthesia problems Neg Hx   . Hypotension Neg Hx   . Malignant hyperthermia Neg Hx   . Pseudochol deficiency Neg Hx     History  Substance Use Topics  . Smoking status: Current Every Day Smoker -- 1.0 packs/day  . Smokeless tobacco: Never Used  . Alcohol Use: No    OB History    Grav Para Term Preterm Abortions TAB SAB Ect Mult Living   3 1 1  2 2    1       Review of Systems  Respiratory: Negative.   Cardiovascular: Negative.   Gastrointestinal: Negative.   Genitourinary:       As per history of present illness  All other systems reviewed and are negative.    Allergies  Review of patient's allergies indicates no known allergies.  Home Medications   Current Outpatient Rx  Name  Route  Sig  Dispense  Refill  . CEPHALEXIN 500 MG PO CAPS   Oral   Take 1 capsule (500 mg total) by mouth 3 (three) times daily.   21 capsule   0   . ESTRADIOL CYPIONATE 5 MG/ML IM OIL   Intramuscular   Inject 1.5 mg into the muscle every 28 (twenty-eight) days.         . IBUPROFEN 200 MG PO TABS   Oral   Take 400 mg by mouth every 6 (six) hours as needed. PAIN           BP 130/84  Pulse 116  Temp 98 F (36.7 C) (Oral)  Resp 18  SpO2 97%  Breastfeeding? No  Physical Exam  Constitutional: She appears well-developed and well-nourished. No distress.  Eyes: EOM are normal.  Neck:  Normal range of motion. Neck supple.  Cardiovascular: Normal rate, regular rhythm and normal heart sounds.   Pulmonary/Chest: Effort normal and breath sounds normal. No respiratory distress. She has no wheezes. She has no rales.  Abdominal: Soft. She exhibits no distension. There is no tenderness.  Skin: Skin is warm and dry.    ED Course  Procedures (including critical care time)  Labs Reviewed  POCT URINALYSIS DIP (DEVICE) - Abnormal; Notable for the following:    Bilirubin Urine SMALL (*)     Ketones, ur 15 (*)     Protein, ur 100 (*)     Urobilinogen, UA 2.0 (*)     Nitrite POSITIVE (*)     All other components within normal limits  POCT PREGNANCY, URINE   No results found.   1. UTI (lower urinary tract infection)       MDM  The patient was having a conversation on the phone while I was interviewing and examining her and her responses were terse, and interrupting. Keflex 500 mg 3 times a day for 7 days Plenty of fluids stay well hydrated Obtain AZO up to 3  times a day as needed for urinary tract symptoms. Oral your physician as needed.       Hayden Rasmussen, NP 11/02/12 [phone]

## 2012-11-02 NOTE — ED Notes (Signed)
Pt c/o poss uti... Onset of sx began this am... Sx include: freq/urgency... Denies: fevers, nauseas, vomiting, diarrhea, abd pain, dysuria, vaginal discharge... She is alert w/no signs of acute distress.

## 2012-11-05 NOTE — ED Provider Notes (Signed)
Medical screening examination/treatment/procedure(s) were performed by resident physician or non-physician practitioner and as supervising physician I was immediately available for consultation/collaboration.   Shalayne Leach DOUGLAS MD.    Armella Stogner D Nael Petrosyan, MD 11/05/12 1127 

## 2013-06-24 ENCOUNTER — Encounter (HOSPITAL_COMMUNITY): Payer: Self-pay | Admitting: Emergency Medicine

## 2013-06-24 ENCOUNTER — Emergency Department (HOSPITAL_COMMUNITY)
Admission: EM | Admit: 2013-06-24 | Discharge: 2013-06-24 | Disposition: A | Discharge status: Home / Self Care | Payer: Medicaid Other | Attending: Emergency Medicine | Admitting: Emergency Medicine

## 2013-06-24 DIAGNOSIS — F17200 Nicotine dependence, unspecified, uncomplicated: Secondary | ICD-10-CM | POA: Insufficient documentation

## 2013-06-24 DIAGNOSIS — Y92410 Unspecified street and highway as the place of occurrence of the external cause: Secondary | ICD-10-CM | POA: Insufficient documentation

## 2013-06-24 DIAGNOSIS — S0083XA Contusion of other part of head, initial encounter: Secondary | ICD-10-CM

## 2013-06-24 DIAGNOSIS — S058X9A Other injuries of unspecified eye and orbit, initial encounter: Secondary | ICD-10-CM | POA: Insufficient documentation

## 2013-06-24 DIAGNOSIS — J45909 Unspecified asthma, uncomplicated: Secondary | ICD-10-CM | POA: Insufficient documentation

## 2013-06-24 DIAGNOSIS — Z8659 Personal history of other mental and behavioral disorders: Secondary | ICD-10-CM | POA: Insufficient documentation

## 2013-06-24 DIAGNOSIS — S0010XA Contusion of unspecified eyelid and periocular area, initial encounter: Secondary | ICD-10-CM | POA: Insufficient documentation

## 2013-06-24 DIAGNOSIS — Y9389 Activity, other specified: Secondary | ICD-10-CM | POA: Insufficient documentation
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
## 2013-06-24 NOTE — ED Notes (Signed)
Pt very tearful and anxious

## 2013-06-26 ENCOUNTER — Emergency Department (HOSPITAL_COMMUNITY): Payer: Medicaid Other

## 2013-06-26 ENCOUNTER — Emergency Department (HOSPITAL_COMMUNITY)
Admission: EM | Admit: 2013-06-26 | Discharge: 2013-06-26 | Disposition: A | Discharge status: Home / Self Care | Payer: Medicaid Other | Attending: Emergency Medicine | Admitting: Emergency Medicine

## 2013-06-26 ENCOUNTER — Encounter (HOSPITAL_COMMUNITY): Payer: Self-pay

## 2013-06-26 DIAGNOSIS — F329 Major depressive disorder, single episode, unspecified: Secondary | ICD-10-CM | POA: Insufficient documentation

## 2013-06-26 DIAGNOSIS — Z3202 Encounter for pregnancy test, result negative: Secondary | ICD-10-CM | POA: Insufficient documentation

## 2013-06-26 DIAGNOSIS — S0003XA Contusion of scalp, initial encounter: Secondary | ICD-10-CM | POA: Insufficient documentation

## 2013-06-26 DIAGNOSIS — Y9389 Activity, other specified: Secondary | ICD-10-CM | POA: Insufficient documentation

## 2013-06-26 DIAGNOSIS — M549 Dorsalgia, unspecified: Secondary | ICD-10-CM

## 2013-06-26 DIAGNOSIS — H538 Other visual disturbances: Secondary | ICD-10-CM | POA: Insufficient documentation

## 2013-06-26 DIAGNOSIS — F3289 Other specified depressive episodes: Secondary | ICD-10-CM | POA: Insufficient documentation

## 2013-06-26 DIAGNOSIS — Y92410 Unspecified street and highway as the place of occurrence of the external cause: Secondary | ICD-10-CM | POA: Insufficient documentation

## 2013-06-26 DIAGNOSIS — F17200 Nicotine dependence, unspecified, uncomplicated: Secondary | ICD-10-CM | POA: Insufficient documentation

## 2013-06-26 DIAGNOSIS — S0083XD Contusion of other part of head, subsequent encounter: Secondary | ICD-10-CM

## 2013-06-26 DIAGNOSIS — J45909 Unspecified asthma, uncomplicated: Secondary | ICD-10-CM | POA: Insufficient documentation

## 2013-06-26 DIAGNOSIS — IMO0002 Reserved for concepts with insufficient information to code with codable children: Secondary | ICD-10-CM | POA: Insufficient documentation

## 2013-06-26 DIAGNOSIS — S0590XA Unspecified injury of unspecified eye and orbit, initial encounter: Secondary | ICD-10-CM | POA: Insufficient documentation

## 2013-06-26 MED ORDER — HYDROCODONE-ACETAMINOPHEN 5-325 MG PO TABS: 1.0000 | ORAL_TABLET | ORAL | Status: DC | PRN

## 2013-06-26 MED ORDER — IBUPROFEN 800 MG PO TABS: 800.0000 mg | ORAL_TABLET | Freq: Three times a day (TID) | ORAL | Status: DC

## 2013-06-26 MED ORDER — CYCLOBENZAPRINE HCL 10 MG PO TABS: 10.0000 mg | ORAL_TABLET | Freq: Two times a day (BID) | ORAL | Status: DC | PRN
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
## 2013-06-26 NOTE — ED Provider Notes (Signed)
Medical screening examination/treatment/procedure(s) were performed by non-physician practitioner and as supervising physician I was immediately available for consultation/collaboration.   Enid Skeens, MD 06/26/13 0030
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
## 2013-06-27 NOTE — ED Provider Notes (Signed)
Medical screening examination/treatment/procedure(s) were performed by non-physician practitioner and as supervising physician I was immediately available for consultation/collaboration.   Katina Remick H Rynlee Lisbon, MD 06/27/13 0004 

## 2013-08-11 ENCOUNTER — Emergency Department (HOSPITAL_COMMUNITY)
Admission: EM | Admit: 2013-08-11 | Discharge: 2013-08-11 | Disposition: A | Discharge status: Home / Self Care | Payer: No Typology Code available for payment source

## 2013-08-20 ENCOUNTER — Emergency Department (HOSPITAL_COMMUNITY)
Admission: EM | Admit: 2013-08-20 | Discharge: 2013-08-20 | Disposition: A | Discharge status: Home / Self Care | Payer: Medicaid Other | Attending: Emergency Medicine | Admitting: Emergency Medicine

## 2013-08-20 ENCOUNTER — Encounter (HOSPITAL_COMMUNITY): Payer: Self-pay | Admitting: *Deleted

## 2013-08-20 DIAGNOSIS — Z79899 Other long term (current) drug therapy: Secondary | ICD-10-CM | POA: Insufficient documentation

## 2013-08-20 DIAGNOSIS — N390 Urinary tract infection, site not specified: Secondary | ICD-10-CM | POA: Insufficient documentation

## 2013-08-20 DIAGNOSIS — F17200 Nicotine dependence, unspecified, uncomplicated: Secondary | ICD-10-CM | POA: Insufficient documentation

## 2013-08-20 DIAGNOSIS — J45909 Unspecified asthma, uncomplicated: Secondary | ICD-10-CM | POA: Insufficient documentation

## 2013-08-20 DIAGNOSIS — Z3202 Encounter for pregnancy test, result negative: Secondary | ICD-10-CM | POA: Insufficient documentation

## 2013-08-20 DIAGNOSIS — Z8659 Personal history of other mental and behavioral disorders: Secondary | ICD-10-CM | POA: Insufficient documentation

## 2013-08-20 LAB — URINE MICROSCOPIC-ADD ON

## 2013-08-20 LAB — URINALYSIS, ROUTINE W REFLEX MICROSCOPIC
Bilirubin Urine: NEGATIVE
Ketones, ur: NEGATIVE mg/dL
Specific Gravity, Urine: 1.031 — ABNORMAL HIGH (ref 1.005–1.030)
Urobilinogen, UA: 1 mg/dL (ref 0.0–1.0)

## 2013-08-20 MED ORDER — NITROFURANTOIN MONOHYD MACRO 100 MG PO CAPS: 100.0000 mg | ORAL_CAPSULE | Freq: Two times a day (BID) | ORAL | Status: AC

## 2013-08-20 MED ORDER — NITROFURANTOIN MONOHYD MACRO 100 MG PO CAPS
100.0000 mg | ORAL_CAPSULE | Freq: Once | ORAL | Status: AC
  Administered 2013-08-20: 100 mg via ORAL
  Filled 2013-08-20: qty 1

## 2013-08-20 NOTE — ED Provider Notes (Signed)
CSN: [id number]     Arrival date & time 08/20/13  1308 History   First MD Initiated Contact with Patient 08/20/13 [phone]     Chief Complaint  Patient presents with  . poss uti    (Consider location/radiation/quality/duration/timing/severity/associated sxs/prior Treatment) HPI Patient states she has had urinary frequency for the past day. She believes she has a urinary tract infection. She denies any dysuria, flank pain, fevers, chills, nausea, vomiting, vaginal discharge or vaginal bleeding. She denies any abdominal pain. Past Medical History  Diagnosis Date  . Asthma   . Depression     zoloft   Past Surgical History  Procedure Laterality Date  . No past surgeries     Family History  Problem Relation Age of Onset  . Anesthesia problems Neg Hx   . Hypotension Neg Hx   . Malignant hyperthermia Neg Hx   . Pseudochol deficiency Neg Hx    History  Substance Use Topics  . Smoking status: Current Every Day Smoker -- 1.00 packs/day  . Smokeless tobacco: Never Used  . Alcohol Use: No   OB History   Grav Para Term Preterm Abortions TAB SAB Ect Mult Living   3 1 1  2 2    1      Review of Systems  Constitutional: Negative for fever and chills.  Gastrointestinal: Negative for nausea, vomiting and abdominal pain.  Genitourinary: Positive for frequency. Negative for dysuria, flank pain, vaginal bleeding, vaginal discharge and pelvic pain.  Skin: Negative for rash and wound.  Neurological: Negative for dizziness and light-headedness.  All other systems reviewed and are negative.    Allergies  Review of patient's allergies indicates no known allergies.  Home Medications   Current Outpatient Rx  Name  Route  Sig  Dispense  Refill  . cyclobenzaprine (FLEXERIL) 10 MG tablet   Oral   Take 1 tablet (10 mg total) by mouth 2 (two) times daily as needed for muscle spasms.   20 tablet   0   . HYDROcodone-acetaminophen (NORCO/VICODIN) 5-325 MG per tablet   Oral   Take 1 tablet by  mouth every 4 (four) hours as needed for pain.   8 tablet   0   . ibuprofen (ADVIL,MOTRIN) 200 MG tablet   Oral   Take 400 mg by mouth daily as needed for headache.         . ibuprofen (ADVIL,MOTRIN) 800 MG tablet   Oral   Take 1 tablet (800 mg total) by mouth 3 (three) times daily.   21 tablet   0    BP 107/66  Pulse 67  Temp(Src) 99.2 F (37.3 C) (Oral)  Resp 20  SpO2 100% Physical Exam  Nursing note and vitals reviewed. Constitutional: She is oriented to person, place, and time. She appears well-developed and well-nourished. No distress.  HENT:  Head: Normocephalic and atraumatic.  Mouth/Throat: Oropharynx is clear and moist.  Eyes: EOM are normal. Pupils are equal, round, and reactive to light.  Neck: Normal range of motion. Neck supple.  Cardiovascular: Normal rate and regular rhythm.   Pulmonary/Chest: Effort normal and breath sounds normal. No respiratory distress. She has no wheezes. She has no rales.  Abdominal: Soft. Bowel sounds are normal. She exhibits no distension and no mass. There is no tenderness. There is no rebound and no guarding.  Musculoskeletal: Normal range of motion. She exhibits no edema and no tenderness.  No CVA tenderness  Neurological: She is alert and oriented to person, place, and time.  Skin: Skin is warm and dry. No rash noted. No erythema.  Psychiatric: She has a normal mood and affect. Her behavior is normal.    ED Course  Procedures (including critical care time) Labs Review Labs Reviewed  URINALYSIS, ROUTINE W REFLEX MICROSCOPIC - Abnormal; Notable for the following:    APPearance TURBID (*)    Specific Gravity, Urine 1.031 (*)    Hgb urine dipstick LARGE (*)    Protein, ur 100 (*)    Leukocytes, UA LARGE (*)    All other components within normal limits  URINE MICROSCOPIC-ADD ON - Abnormal; Notable for the following:    Bacteria, UA FEW (*)    All other components within normal limits  URINE CULTURE  PREGNANCY, URINE    Imaging Review No results found.  MDM  We'll treat for urinary tract infection. Return precautions given.    Loren Racer, MD 08/20/13 [phone]

## 2013-08-20 NOTE — ED Notes (Signed)
The pt reports that she has a bladder infection for several days.  lmp none depo

## 2013-08-21 LAB — URINE CULTURE

## 2014-02-14 ENCOUNTER — Emergency Department (HOSPITAL_COMMUNITY)
Admission: EM | Admit: 2014-02-14 | Discharge: 2014-02-14 | Disposition: A | Discharge status: Home / Self Care | Payer: Medicaid Other | Source: Home / Self Care | Attending: Emergency Medicine | Admitting: Emergency Medicine

## 2014-02-14 ENCOUNTER — Encounter (HOSPITAL_COMMUNITY): Payer: Self-pay | Admitting: Emergency Medicine

## 2014-02-14 DIAGNOSIS — N390 Urinary tract infection, site not specified: Secondary | ICD-10-CM

## 2014-02-14 LAB — POCT URINALYSIS DIP (DEVICE)
Bilirubin Urine: NEGATIVE
Glucose, UA: NEGATIVE mg/dL
Ketones, ur: NEGATIVE mg/dL
NITRITE: POSITIVE — AB
PH: 7 (ref 5.0–8.0)
PROTEIN: 30 mg/dL — AB
Specific Gravity, Urine: 1.025 (ref 1.005–1.030)
UROBILINOGEN UA: 1 mg/dL (ref 0.0–1.0)

## 2014-02-14 LAB — POCT PREGNANCY, URINE: Preg Test, Ur: NEGATIVE

## 2014-02-14 MED ORDER — NITROFURANTOIN MONOHYD MACRO 100 MG PO CAPS: 100.0000 mg | ORAL_CAPSULE | Freq: Two times a day (BID) | ORAL | Status: AC

## 2014-02-14 NOTE — ED Provider Notes (Signed)
Medical screening examination/treatment/procedure(s) were performed by non-physician practitioner and as supervising physician I was immediately available for consultation/collaboration.  Leslee Homeavid Faolan [place], M.D.  Reuben Likesavid C Jeanene Mena, MD 02/14/14 [phone]

## 2014-02-14 NOTE — ED Notes (Signed)
Pt c/o poss UTI onset 1 week Sxs include: urinary freq/urgency, dysuria Denies f/v/n/d, hematuria, vag d/c, abd/back pain Alert w/no signs of acute distress.

## 2014-02-14 NOTE — ED Provider Notes (Signed)
CSN: [id number]     Arrival date & time 02/14/14  1048 History   First MD Initiated Contact with Patient 02/14/14 1204     Chief Complaint  Patient presents with  . Urinary Tract Infection   (Consider location/radiation/quality/duration/timing/severity/associated sxs/prior Treatment) HPI Comments: Patient provides very limited history as she will not look up from cell phone to participate in conversation during history. Either provides single "yes" or "no" answers to questions during hx or states "I don't know."  When asked by staff to put cell phone aside, patient simply looks up briefly and then returns to looking at cell phone. Reports urinary frequency for 5-7 days. Denies hematuria, fever, vaginal bleeding, vaginal discharge, abdominal pain, flank pain, nausea, vomiting. Does not answer when asked when last UTI was and will not discuss previous episodes or previous treatment.   The history is provided by the patient.    Past Medical History  Diagnosis Date  . Asthma   . Depression     zoloft   Past Surgical History  Procedure Laterality Date  . No past surgeries     Family History  Problem Relation Age of Onset  . Anesthesia problems Neg Hx   . Hypotension Neg Hx   . Malignant hyperthermia Neg Hx   . Pseudochol deficiency Neg Hx    History  Substance Use Topics  . Smoking status: Current Every Day Smoker -- 1.00 packs/day  . Smokeless tobacco: Never Used  . Alcohol Use: No   OB History   Grav Para Term Preterm Abortions TAB SAB Ect Mult Living   3 1 1  2 2    1      Review of Systems  Unable to perform ROS Constitutional:       Patient will not answer ROS questions    Allergies  Review of patient's allergies indicates no known allergies.  Home Medications   Current Outpatient Rx  Name  Route  Sig  Dispense  Refill  . nitrofurantoin, macrocrystal-monohydrate, (MACROBID) 100 MG capsule   Oral   Take 1 capsule (100 mg total) by mouth 2 (two) times daily. X  7 days   14 capsule   0   . nitrofurantoin, macrocrystal-monohydrate, (MACROBID) 100 MG capsule   Oral   Take 1 capsule (100 mg total) by mouth 2 (two) times daily.   10 capsule   0    There were no vitals taken for this visit. Physical Exam  Nursing note reviewed. Constitutional: She is oriented to person, place, and time. She appears well-developed and well-nourished. No distress.  Declines vitals  HENT:  Head: Normocephalic and atraumatic.  Eyes: Conjunctivae are normal. No scleral icterus.  Pulmonary/Chest: Effort normal.  Abdominal: There is no tenderness.  Musculoskeletal: Normal range of motion.  Neurological: She is alert and oriented to person, place, and time.  Skin: Skin is warm and dry.  Psychiatric:  +abrasive and curt    ED Course  Procedures (including critical care time) Labs Review Labs Reviewed  POCT URINALYSIS DIP (DEVICE) - Abnormal; Notable for the following:    Hgb urine dipstick MODERATE (*)    Protein, ur 30 (*)    Nitrite POSITIVE (*)    Leukocytes, UA LARGE (*)    All other components within normal limits  URINE CULTURE  POCT PREGNANCY, URINE   Imaging Review No results found.   MDM   1. UTI (lower urinary tract infection)   Macrobid as prescribed with PCP follow up if no improvement.  Jess BartersJennifer Lee DonnaPresson, GeorgiaPA 02/14/14 [phone]

## 2014-02-16 LAB — URINE CULTURE
Colony Count: >=100000
Special Requests: NORMAL

## 2014-02-16 NOTE — ED Notes (Signed)
Urine culture: >100,000 colonies E. Coli.  Pt. adequately treated with Macrobid. Vassie MoselleYork, Aleigh Grunden M 02/16/2014

## 2014-03-31 ENCOUNTER — Encounter (HOSPITAL_BASED_OUTPATIENT_CLINIC_OR_DEPARTMENT_OTHER): Payer: Self-pay | Admitting: Emergency Medicine

## 2014-03-31 DIAGNOSIS — F3289 Other specified depressive episodes: Secondary | ICD-10-CM | POA: Insufficient documentation

## 2014-03-31 DIAGNOSIS — Z792 Long term (current) use of antibiotics: Secondary | ICD-10-CM | POA: Insufficient documentation

## 2014-03-31 DIAGNOSIS — F17200 Nicotine dependence, unspecified, uncomplicated: Secondary | ICD-10-CM | POA: Insufficient documentation

## 2014-03-31 DIAGNOSIS — J45909 Unspecified asthma, uncomplicated: Secondary | ICD-10-CM | POA: Insufficient documentation

## 2014-03-31 DIAGNOSIS — F329 Major depressive disorder, single episode, unspecified: Secondary | ICD-10-CM | POA: Insufficient documentation

## 2014-03-31 DIAGNOSIS — IMO0002 Reserved for concepts with insufficient information to code with codable children: Secondary | ICD-10-CM | POA: Insufficient documentation

## 2014-03-31 DIAGNOSIS — Z79899 Other long term (current) drug therapy: Secondary | ICD-10-CM | POA: Insufficient documentation

## 2014-03-31 DIAGNOSIS — Z8659 Personal history of other mental and behavioral disorders: Secondary | ICD-10-CM | POA: Insufficient documentation

## 2014-03-31 NOTE — ED Notes (Signed)
Pt c/o ? Insect bite to left arm x 2 days ago redness and pain

## 2014-04-01 ENCOUNTER — Emergency Department (HOSPITAL_COMMUNITY)
Admission: EM | Admit: 2014-04-01 | Discharge: 2014-04-01 | Disposition: A | Discharge status: Home / Self Care | Payer: Medicaid Other | Attending: Emergency Medicine | Admitting: Emergency Medicine

## 2014-04-01 ENCOUNTER — Emergency Department (HOSPITAL_COMMUNITY)
Admission: EM | Admit: 2014-04-01 | Discharge: 2014-04-01 | Disposition: A | Discharge status: Home / Self Care | Payer: Medicaid Other | Source: Home / Self Care | Attending: Emergency Medicine | Admitting: Emergency Medicine

## 2014-04-01 ENCOUNTER — Encounter (HOSPITAL_COMMUNITY): Payer: Self-pay | Admitting: Emergency Medicine

## 2014-04-01 DIAGNOSIS — L0390 Cellulitis, unspecified: Principal | ICD-10-CM

## 2014-04-01 DIAGNOSIS — L0291 Cutaneous abscess, unspecified: Secondary | ICD-10-CM

## 2014-04-01 DIAGNOSIS — L03114 Cellulitis of left upper limb: Secondary | ICD-10-CM

## 2014-04-01 MED ORDER — DOXYCYCLINE HYCLATE 100 MG PO TABS
100.0000 mg | ORAL_TABLET | Freq: Once | ORAL | Status: AC
  Administered 2014-04-01: 100 mg via ORAL
  Filled 2014-04-01: qty 1

## 2014-04-01 MED ORDER — DOXYCYCLINE HYCLATE 100 MG PO CAPS: 100.0000 mg | ORAL_CAPSULE | Freq: Two times a day (BID) | ORAL | Status: AC

## 2014-04-01 MED ORDER — HYDROCODONE-ACETAMINOPHEN 5-325 MG PO TABS
1.0000 | ORAL_TABLET | Freq: Once | ORAL | Status: AC
  Administered 2014-04-01: 1 via ORAL
  Filled 2014-04-01: qty 1

## 2014-04-01 NOTE — Discharge Instructions (Signed)
Please read and follow all provided instructions.  Your diagnoses today include:  1. Cellulitis and abscess     Tests performed today include:  Vital signs. See below for your results today.   Medications prescribed:   Doxycycline - antibiotic  You have been prescribed an antibiotic medicine: take the entire course of medicine even if you are feeling better. Stopping early can cause the antibiotic not to work.  Take any prescribed medications only as directed.   Home care instructions:   Follow any educational materials contained in this packet  Soak your arm in warm water for 10 minutes, three times a day to help keep the area draining  Follow-up instructions: Return to the Emergency Department or see your doctor in 48 hours for recheck.  Return instructions:  Return to the Emergency Department if you have:  Fever  Worsening symptoms  Worsening pain  Worsening swelling  Redness of the skin that moves away from the affected area, especially if it streaks away from the affected area   Any other emergent concerns  Your vital signs today were: BP 110/67   Pulse 86   Temp(Src) 98.3 F (36.8 C) (Oral)   Resp 20   SpO2 100% If your blood pressure (BP) was elevated above 135/85 this visit, please have this repeated by your doctor within one month. --------------

## 2014-04-01 NOTE — ED Provider Notes (Signed)
CSN: [id number]     Arrival date & time 04/01/14  1750 History   None    This chart was scribed for non-physician practitioner, Rhea BleacherJosh Lizette Pazos PA-C working with Gerhard Munchobert Lockwood, MD by Arlan OrganAshley Leger, ED Scribe. This patient was seen in room WTR9/WTR9 and the patient's care was started at 6:29 PM.   CC: abscess  The history is provided by the patient. No language interpreter was used.    HPI Comments: Gloria King is a 23 y.o. female with a PMHx of Asthma and depression who presents to the Emergency Department complaining of a gradual onset abscess to the L volar forearm x 2 days that is progressively worsening. Pt was seen yesterday at [facility] for same and was treated with Doxycycline. No I&D at that time. However, she denies getting the antibiotic filled, but has filled pain medication. Pt attributes this abscess to a possible insect bit or sting sustained a few days ago. At this time she denies any fever or chills. Denies any known allergies to medications. She has no other pertinent past medical history. No other concerns this visit.  She is followed by Dr. Fleet ContrasEdwin Avbuere PCP  Past Medical History  Diagnosis Date  . Asthma   . Depression     zoloft   Past Surgical History  Procedure Laterality Date  . No past surgeries     Family History  Problem Relation Age of Onset  . Anesthesia problems Neg Hx   . Hypotension Neg Hx   . Malignant hyperthermia Neg Hx   . Pseudochol deficiency Neg Hx    History  Substance Use Topics  . Smoking status: Current Every Day Smoker -- 1.00 packs/day  . Smokeless tobacco: Never Used  . Alcohol Use: No   OB History   Grav Para Term Preterm Abortions TAB SAB Ect Mult Living   3 1 1  2 2    1      Review of Systems  Constitutional: Negative for fever and chills.  HENT: Positive for congestion.   Eyes: Negative for redness.  Gastrointestinal: Negative for nausea and vomiting.  Skin: Positive for wound (L volar forearm). Negative for color  change.       Positive for abscess.  Neurological: Negative for numbness.  Hematological: Negative for adenopathy.  Psychiatric/Behavioral: Negative for confusion.    Allergies  Review of patient's allergies indicates no known allergies.  Home Medications   Prior to Admission medications   Medication Sig Start Date End Date Taking? Authorizing Provider  doxycycline (VIBRAMYCIN) 100 MG capsule Take 1 capsule (100 mg total) by mouth 2 (two) times daily. One po bid x 7 days 04/01/14   Carlisle BeersJohn L Molpus, MD  nitrofurantoin, macrocrystal-monohydrate, (MACROBID) 100 MG capsule Take 1 capsule (100 mg total) by mouth 2 (two) times daily. X 7 days 08/20/13   Loren Raceravid Yelverton, MD  nitrofurantoin, macrocrystal-monohydrate, (MACROBID) 100 MG capsule Take 1 capsule (100 mg total) by mouth 2 (two) times daily. 02/14/14   Ardis RowanJennifer Lee Presson, PA   Triage Vitals: BP 110/67  Pulse 86  Temp(Src) 98.3 F (36.8 C) (Oral)  Resp 20  SpO2 100%   Physical Exam  Nursing note and vitals reviewed. Constitutional: She appears well-developed and well-nourished.  HENT:  Head: Normocephalic and atraumatic.  Eyes: Conjunctivae and EOM are normal.  Neck: Normal range of motion. Neck supple.  Cardiovascular: Normal rate.   Pulmonary/Chest: Effort normal. No respiratory distress.  Musculoskeletal: Normal range of motion.  Neurological: She is alert.  Skin: Skin is warm and dry.  Approximately 2 cm x 2 cm area of induration left volar forearm surrounding erythema and mild lymphangitis extending several centimeters proximally towards the elbow. It is minimally tender. Redness extends past marker line made at previous visit.  Psychiatric: She has a normal mood and affect. Her behavior is normal.    ED Course  Procedures (including critical care time)  DIAGNOSTIC STUDIES: Oxygen Saturation is 100% on RA, Normal by my interpretation.    COORDINATION OF CARE: 6:30 PM- Will perform I&D procedure. Advised pt to follow  up with PCP or ED in 48 hours. Discussed treatment plan with pt at bedside and pt agreed to plan.     6:52 PM- INCISION AND DRAINAGE PROCEDURE NOTE: Patient identification was confirmed and verbal consent was obtained. This procedure was performed by Rhea BleacherJosh Joydan Gretzinger PA-C at 6:52 PM. Site: L volar forearm  Sterile procedures observed Anesthetic used (type and amt): 2 CC of 2% Lidocaine without epinephrine  Drainage: Small amount of pus Complexity: Complex Packing used: None Site anesthetized, incision made over site, wound drained and explored Loculations, covered with dry, sterile dressing.  Pt tolerated procedure well without complications.  Instructions for care discussed verbally and pt provided with additional written instructions for homecare and f/u.   Labs Review Labs Reviewed - No data to display  Imaging Review No results found.   EKG Interpretation None      EMERGENCY DEPARTMENT US SOFT TISSUE INTERPRETATION "Study: Limited Ultrasound of the noted body part in comments below"  INDICATIONS: Soft tissue infection Multiple views of the body part are obtained with a multi-frequency linear probe  PERFORMED BY:  Myself  IMAGES ARCHIVED?: No  SIDE:Left  BODY PART:Upper extremity  FINDINGS: Abcess present  LIMITATIONS:  none  INTERPRETATION:  Abcess present and Cellulitis present  COMMENT: 5mm x 5mm collection of fluid noted approximately 5mm below surface of skin.   Patient was given doxycycline in emergency department. She was strongly encouraged to fill his prescription tonight and take as directed. Patient told to return to the emergency department or followup with her primary physician in 2 days for a recheck. She is to return sooner if worsening. The patient was urged to return to the Emergency Department urgently with worsening pain, swelling, expanding erythema especially if it streaks further away from the affected area, fever, or if they have any other  concerns.   The patient verbalized understanding and stated agreement with this plan.     MDM   Final diagnoses:  Cellulitis and abscess   Patient with skin abscess amenable to incision and drainage. Abscess was large enough to warrant packing with removal and wound recheck in 2 days. No signs of cellulitis is surrounding skin. Will d/c to home. No antibiotic therapy is indicated.   I personally performed the services described in this documentation, which was scribed in my presence. The recorded information has been reviewed and is accurate.    Renne CriglerJoshua Valina Maes, PA-C 04/01/14 [phone]

## 2014-04-01 NOTE — ED Notes (Signed)
Patient was seen at [facility] last night for abscess on left forearm. Patient states it is worst today more painful.

## 2014-04-01 NOTE — ED Provider Notes (Signed)
  Medical screening examination/treatment/procedure(s) were performed by non-physician practitioner and as supervising physician I was immediately available for consultation/collaboration.   EKG Interpretation None         Dandre Sisler, MD 04/01/14 2359 

## 2014-04-01 NOTE — ED Notes (Signed)
Insect bite to left fore arm  Increased swelling and redness  Onset 2 days ago

## 2014-04-01 NOTE — ED Provider Notes (Addendum)
CSN: [id number]     Arrival date & time 03/31/14  2331 History   First MD Initiated Contact with Patient 04/01/14 0243     Chief Complaint  Patient presents with  . Insect Bite     (Consider location/radiation/quality/duration/timing/severity/associated sxs/prior Treatment) HPI This is a 23 year old female who noticed tender erythematous patch on her left volar forearm sometime yesterday. She thinks it came on gradually. She is not sure if it was initiated by an insect bite or sting. There is moderate pain associated with it, worse with palpation. She has had low-grade fever with it.  Past Medical History  Diagnosis Date  . Asthma   . Depression     zoloft   Past Surgical History  Procedure Laterality Date  . No past surgeries     Family History  Problem Relation Age of Onset  . Anesthesia problems Neg Hx   . Hypotension Neg Hx   . Malignant hyperthermia Neg Hx   . Pseudochol deficiency Neg Hx    History  Substance Use Topics  . Smoking status: Current Every Day Smoker -- 1.00 packs/day  . Smokeless tobacco: Never Used  . Alcohol Use: No   OB History   Grav Para Term Preterm Abortions TAB SAB Ect Mult Living   3 1 1  2 2    1      Review of Systems  All other systems reviewed and are negative.   Allergies  Review of patient's allergies indicates no known allergies.  Home Medications   Prior to Admission medications   Medication Sig Start Date End Date Taking? Authorizing Provider  nitrofurantoin, macrocrystal-monohydrate, (MACROBID) 100 MG capsule Take 1 capsule (100 mg total) by mouth 2 (two) times daily. X 7 days 08/20/13   Loren Raceravid Yelverton, MD  nitrofurantoin, macrocrystal-monohydrate, (MACROBID) 100 MG capsule Take 1 capsule (100 mg total) by mouth 2 (two) times daily. 02/14/14   Jess BartersJennifer Lee Presson, PA   BP 115/82  Pulse 78  Temp(Src) 99.4 F (37.4 C) (Oral)  Resp 16  Ht 5' (1.524 m)  Wt 120 lb (54.432 kg)  BMI 23.44 kg/m2  SpO2 100%  Physical  Exam General: Well-developed, well-nourished female in no acute distress; appearance consistent with age of record HENT: normocephalic; atraumatic Eyes: Normal appearance Neck: supple Heart: regular rate and rhythm Lungs: Normal respiratory effort and excursion Abdomen: soft; nondistended Extremities: No deformity; full range of motion; tender raised, erythematous wheal on left volar forearm, about 6cm x 11cm, with central black punctum but no fluctuance or pointing Neurologic: Awake, alert and oriented; motor function intact in all extremities and symmetric; no facial droop Skin: Warm and dry Psychiatric: Flat affect     ED Course  Procedures (including critical care time)  MDM  Suspected acute cellulitis possibly caused by insect bite. Treat with doxycycline. The margins were marked with a skin marking pen. She was advised that should this extend over the next 24-48 hours, or should an abscess form she should return for further treatment.    Hanley SeamenJohn L Jillene Wehrenberg, MD 04/01/14 [phone]  Hanley SeamenJohn L Deshondra Worst, MD 04/01/14 [phone]

## 2014-04-01 NOTE — ED Notes (Signed)
MD at bedside. 
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
## 2014-09-18 ENCOUNTER — Encounter (HOSPITAL_COMMUNITY): Payer: Self-pay | Admitting: Emergency Medicine

## 2015-05-23 ENCOUNTER — Encounter (HOSPITAL_COMMUNITY): Payer: Self-pay | Admitting: Emergency Medicine

## 2015-05-23 ENCOUNTER — Emergency Department (HOSPITAL_COMMUNITY)
Admission: EM | Admit: 2015-05-23 | Discharge: 2015-05-23 | Disposition: A | Discharge status: Home / Self Care | Payer: Medicaid Other | Attending: Emergency Medicine | Admitting: Emergency Medicine

## 2015-05-23 DIAGNOSIS — M79602 Pain in left arm: Secondary | ICD-10-CM

## 2015-05-23 DIAGNOSIS — S199XXA Unspecified injury of neck, initial encounter: Secondary | ICD-10-CM | POA: Insufficient documentation

## 2015-05-23 DIAGNOSIS — S4992XA Unspecified injury of left shoulder and upper arm, initial encounter: Secondary | ICD-10-CM | POA: Insufficient documentation

## 2015-05-23 DIAGNOSIS — Z79899 Other long term (current) drug therapy: Secondary | ICD-10-CM | POA: Insufficient documentation

## 2015-05-23 DIAGNOSIS — J45909 Unspecified asthma, uncomplicated: Secondary | ICD-10-CM | POA: Diagnosis not present

## 2015-05-23 DIAGNOSIS — Y92410 Unspecified street and highway as the place of occurrence of the external cause: Secondary | ICD-10-CM | POA: Insufficient documentation

## 2015-05-23 DIAGNOSIS — Y9389 Activity, other specified: Secondary | ICD-10-CM | POA: Diagnosis not present

## 2015-05-23 DIAGNOSIS — S8991XA Unspecified injury of right lower leg, initial encounter: Secondary | ICD-10-CM | POA: Diagnosis not present

## 2015-05-23 DIAGNOSIS — F329 Major depressive disorder, single episode, unspecified: Secondary | ICD-10-CM | POA: Diagnosis not present

## 2015-05-23 DIAGNOSIS — Z720 Tobacco use: Secondary | ICD-10-CM | POA: Insufficient documentation

## 2015-05-23 DIAGNOSIS — M25561 Pain in right knee: Secondary | ICD-10-CM

## 2015-05-23 DIAGNOSIS — Y998 Other external cause status: Secondary | ICD-10-CM | POA: Diagnosis not present

## 2015-05-23 MED ORDER — NAPROXEN 375 MG PO TABS
375.0000 mg | ORAL_TABLET | Freq: Two times a day (BID) | ORAL | Status: AC
Start: 2015-05-23 — End: ?

## 2015-05-23 MED ORDER — CYCLOBENZAPRINE HCL 10 MG PO TABS: 10.0000 mg | ORAL_TABLET | Freq: Two times a day (BID) | ORAL | Status: AC | PRN
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
## 2015-05-23 NOTE — Discharge Instructions (Signed)
Arthralgia °Your caregiver has diagnosed you as suffering from an arthralgia. Arthralgia means there is pain in a joint. This can come from many reasons including: °· Bruising the joint which causes soreness (inflammation) in the joint. °· Wear and tear on the joints which occur as we grow older (osteoarthritis). °· Overusing the joint. °· Various forms of arthritis. °· Infections of the joint. °Regardless of the cause of pain in your joint, most of these different pains respond to anti-inflammatory drugs and rest. The exception to this is when a joint is infected, and these cases are treated with antibiotics, if it is a bacterial infection. °HOME CARE INSTRUCTIONS  °· Rest the injured area for as long as directed by your caregiver. Then slowly start using the joint as directed by your caregiver and as the pain allows. Crutches as directed may be useful if the ankles, knees or hips are involved. If the knee was splinted or casted, continue use and care as directed. If an stretchy or elastic wrapping bandage has been applied today, it should be removed and re-applied every 3 to 4 hours. It should not be applied tightly, but firmly enough to keep swelling down. Watch toes and feet for swelling, bluish discoloration, coldness, numbness or excessive pain. If any of these problems (symptoms) occur, remove the ace bandage and re-apply more loosely. If these symptoms persist, contact your caregiver or return to this location. °· For the first 24 hours, keep the injured extremity elevated on pillows while lying down. °· Apply ice for 15-20 minutes to the sore joint every couple hours while awake for the first half day. Then 03-04 times per day for the first 48 hours. Put the ice in a plastic bag and place a towel between the bag of ice and your skin. °· Wear any splinting, casting, elastic bandage applications, or slings as instructed. °· Only take over-the-counter or prescription medicines for pain, discomfort, or fever as  directed by your caregiver. Do not use aspirin immediately after the injury unless instructed by your physician. Aspirin can cause increased bleeding and bruising of the tissues. °· If you were given crutches, continue to use them as instructed and do not resume weight bearing on the sore joint until instructed. °Persistent pain and inability to use the sore joint as directed for more than 2 to 3 days are warning signs indicating that you should see a caregiver for a follow-up visit as soon as possible. Initially, a hairline fracture (break in bone) may not be evident on X-rays. Persistent pain and swelling indicate that further evaluation, non-weight bearing or use of the joint (use of crutches or slings as instructed), or further X-rays are indicated. X-rays may sometimes not show a small fracture until a week or 10 days later. Make a follow-up appointment with your own caregiver or one to whom we have referred you. A radiologist (specialist in reading X-rays) may read your X-rays. Make sure you know how you are to obtain your X-ray results. Do not assume everything is normal if you do not hear from us. °SEEK MEDICAL CARE IF: °Bruising, swelling, or pain increases. °SEEK IMMEDIATE MEDICAL CARE IF:  °· Your fingers or toes are numb or blue. °· The pain is not responding to medications and continues to stay the same or get worse. °· The pain in your joint becomes severe. °· You develop a fever over 102° F (38.9° C). °· It becomes impossible to move or use the joint. °MAKE SURE YOU:  °·   Understand these instructions. °· Will watch your condition. °· Will get help right away if you are not doing well or get worse. °Document Released: 11/03/2005 Document Revised: 01/26/2012 Document Reviewed: 06/21/2008 °[employer]® Patient Information ©2015 [employer]. This information is not intended to replace advice given to you by your health care provider. Make sure you discuss any questions you have with your health care  provider. °Motor Vehicle Collision °It is common to have multiple bruises and sore muscles after a motor vehicle collision (MVC). These tend to feel worse for the first 24 hours. You may have the most stiffness and soreness over the first several hours. You may also feel worse when you wake up the first morning after your collision. After this point, you will usually begin to improve with each day. The speed of improvement often depends on the severity of the collision, the number of injuries, and the location and nature of these injuries. °HOME CARE INSTRUCTIONS °· Put ice on the injured area. °¨ Put ice in a plastic bag. °¨ Place a towel between your skin and the bag. °¨ Leave the ice on for 15-20 minutes, 3-4 times a day, or as directed by your health care provider. °· Drink enough fluids to keep your urine clear or pale yellow. Do not drink alcohol. °· Take a warm shower or bath once or twice a day. This will increase blood flow to sore muscles. °· You may return to activities as directed by your caregiver. Be careful when lifting, as this may aggravate neck or back pain. °· Only take over-the-counter or prescription medicines for pain, discomfort, or fever as directed by your caregiver. Do not use aspirin. This may increase bruising and bleeding. °SEEK IMMEDIATE MEDICAL CARE IF: °· You have numbness, tingling, or weakness in the arms or legs. °· You develop severe headaches not relieved with medicine. °· You have severe neck pain, especially tenderness in the middle of the back of your neck. °· You have changes in bowel or bladder control. °· There is increasing pain in any area of the body. °· You have shortness of breath, light-headedness, dizziness, or fainting. °· You have chest pain. °· You feel sick to your stomach (nauseous), throw up (vomit), or sweat. °· You have increasing abdominal discomfort. °· There is blood in your urine, stool, or vomit. °· You have pain in your shoulder (shoulder strap  areas). °· You feel your symptoms are getting worse. °MAKE SURE YOU: °· Understand these instructions. °· Will watch your condition. °· Will get help right away if you are not doing well or get worse. °Document Released: 11/03/2005 Document Revised: 03/20/2014 Document Reviewed: 04/02/2011 °[employer]® Patient Information ©2015 [employer]. This information is not intended to replace advice given to you by your health care provider. Make sure you discuss any questions you have with your health care provider. ° °

## 2015-05-23 NOTE — ED Notes (Signed)
Pt verbalizes understanding of d/c instructions and denies any further needs at this time. 
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
